# Patient Record
Sex: Female | Born: 1990 | Race: Black or African American | Hispanic: No | Marital: Single | Smoking: Former smoker
Health system: Southern US, Community
[De-identification: ages and names within clinical notes are randomized; demographics above are authoritative.]

## PROBLEM LIST (undated history)

## (undated) ENCOUNTER — Emergency Department (HOSPITAL_COMMUNITY): Admission: EM | Payer: Managed Care, Other (non HMO) | Source: Home / Self Care

## (undated) DIAGNOSIS — G510 Bell's palsy: Secondary | ICD-10-CM

## (undated) DIAGNOSIS — M0800 Unspecified juvenile rheumatoid arthritis of unspecified site: Secondary | ICD-10-CM

## (undated) DIAGNOSIS — O99345 Other mental disorders complicating the puerperium: Secondary | ICD-10-CM

## (undated) DIAGNOSIS — F53 Postpartum depression: Secondary | ICD-10-CM

## (undated) DIAGNOSIS — J039 Acute tonsillitis, unspecified: Secondary | ICD-10-CM

## (undated) HISTORY — PX: OTHER SURGICAL HISTORY: SHX169

## (undated) HISTORY — DX: Unspecified juvenile rheumatoid arthritis of unspecified site: M08.00

## (undated) HISTORY — PX: ABDOMINAL SURGERY: SHX537

## (undated) HISTORY — DX: Bell's palsy: G51.0

---
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
## 2010-04-11 ENCOUNTER — Emergency Department (HOSPITAL_COMMUNITY): Admission: EM | Admit: 2010-04-11 | Discharge: 2010-04-12 | Payer: Self-pay | Admitting: Emergency Medicine

## 2010-08-03 ENCOUNTER — Other Ambulatory Visit (HOSPITAL_COMMUNITY): Payer: Self-pay | Admitting: Obstetrics and Gynecology

## 2010-08-03 DIAGNOSIS — IMO0002 Reserved for concepts with insufficient information to code with codable children: Secondary | ICD-10-CM

## 2010-08-03 DIAGNOSIS — Z0489 Encounter for examination and observation for other specified reasons: Secondary | ICD-10-CM

## 2010-08-08 LAB — WET PREP, GENITAL
Trich, Wet Prep: NONE SEEN
Yeast Wet Prep HPF POC: NONE SEEN

## 2010-08-08 LAB — URINALYSIS, ROUTINE W REFLEX MICROSCOPIC
Glucose, UA: NEGATIVE mg/dL
Hgb urine dipstick: NEGATIVE
Ketones, ur: 40 mg/dL — AB
Leukocytes, UA: NEGATIVE
Nitrite: NEGATIVE
Protein, ur: 30 mg/dL — AB
Specific Gravity, Urine: 1.035 — ABNORMAL HIGH (ref 1.005–1.030)
Urobilinogen, UA: 1 mg/dL (ref 0.0–1.0)
pH: 6.5 (ref 5.0–8.0)

## 2010-08-08 LAB — URINE MICROSCOPIC-ADD ON

## 2010-08-08 LAB — POCT I-STAT, CHEM 8
BUN: 11 mg/dL (ref 6–23)
Calcium, Ion: 1.24 mmol/L (ref 1.12–1.32)
Chloride: 101 mEq/L (ref 96–112)
Creatinine, Ser: 0.7 mg/dL (ref 0.4–1.2)
Glucose, Bld: 88 mg/dL (ref 70–99)
HCT: 45 % (ref 36.0–46.0)
Hemoglobin: 15.3 g/dL — ABNORMAL HIGH (ref 12.0–15.0)
Potassium: 3.9 mEq/L (ref 3.5–5.1)
Sodium: 137 mEq/L (ref 135–145)
TCO2: 27 mmol/L (ref 0–100)

## 2010-08-08 LAB — HCG, QUANTITATIVE, PREGNANCY: hCG, Beta Chain, Quant, S: 99675 m[IU]/mL — ABNORMAL HIGH (ref <5)

## 2010-08-08 LAB — GC/CHLAMYDIA PROBE AMP, GENITAL
Chlamydia, DNA Probe: NEGATIVE
GC Probe Amp, Genital: NEGATIVE

## 2010-08-08 LAB — POCT PREGNANCY, URINE: Preg Test, Ur: POSITIVE

## 2010-08-08 LAB — RPR: RPR Ser Ql: NONREACTIVE

## 2010-08-16 ENCOUNTER — Ambulatory Visit (HOSPITAL_COMMUNITY): Payer: Medicaid Other

## 2010-08-16 ENCOUNTER — Ambulatory Visit (HOSPITAL_COMMUNITY)
Admission: RE | Admit: 2010-08-16 | Discharge: 2010-08-16 | Disposition: A | Discharge status: Home / Self Care | Payer: Managed Care, Other (non HMO) | Source: Ambulatory Visit | Attending: Obstetrics and Gynecology | Admitting: Obstetrics and Gynecology

## 2010-08-16 ENCOUNTER — Inpatient Hospital Stay (HOSPITAL_COMMUNITY)
Admission: AD | Admit: 2010-08-16 | Discharge: 2010-08-19 | DRG: 778 | Disposition: A | Discharge status: Home / Self Care | Payer: Managed Care, Other (non HMO) | Source: Ambulatory Visit | Attending: Obstetrics and Gynecology | Admitting: Obstetrics and Gynecology

## 2010-08-16 ENCOUNTER — Other Ambulatory Visit (HOSPITAL_COMMUNITY): Payer: Self-pay | Admitting: Obstetrics and Gynecology

## 2010-08-16 ENCOUNTER — Inpatient Hospital Stay (HOSPITAL_COMMUNITY)
Admission: AD | Admit: 2010-08-16 | Payer: Managed Care, Other (non HMO) | Source: Ambulatory Visit | Admitting: Obstetrics and Gynecology

## 2010-08-16 DIAGNOSIS — O30049 Twin pregnancy, dichorionic/diamniotic, unspecified trimester: Secondary | ICD-10-CM

## 2010-08-16 DIAGNOSIS — IMO0002 Reserved for concepts with insufficient information to code with codable children: Secondary | ICD-10-CM

## 2010-08-16 DIAGNOSIS — Z363 Encounter for antenatal screening for malformations: Secondary | ICD-10-CM | POA: Insufficient documentation

## 2010-08-16 DIAGNOSIS — Z0489 Encounter for examination and observation for other specified reasons: Secondary | ICD-10-CM

## 2010-08-16 DIAGNOSIS — O26879 Cervical shortening, unspecified trimester: Secondary | ICD-10-CM | POA: Diagnosis present

## 2010-08-16 DIAGNOSIS — O358XX0 Maternal care for other (suspected) fetal abnormality and damage, not applicable or unspecified: Secondary | ICD-10-CM | POA: Insufficient documentation

## 2010-08-16 DIAGNOSIS — Z1389 Encounter for screening for other disorder: Secondary | ICD-10-CM | POA: Insufficient documentation

## 2010-08-16 DIAGNOSIS — O30009 Twin pregnancy, unspecified number of placenta and unspecified number of amniotic sacs, unspecified trimester: Secondary | ICD-10-CM

## 2010-08-16 DIAGNOSIS — O4700 False labor before 37 completed weeks of gestation, unspecified trimester: Principal | ICD-10-CM | POA: Diagnosis present

## 2010-08-16 LAB — URINALYSIS, ROUTINE W REFLEX MICROSCOPIC
Bilirubin Urine: NEGATIVE
Glucose, UA: NEGATIVE mg/dL
Hgb urine dipstick: NEGATIVE
Ketones, ur: >80 mg/dL — AB
Nitrite: NEGATIVE
Protein, ur: NEGATIVE mg/dL
Specific Gravity, Urine: >1.03 — ABNORMAL HIGH (ref 1.005–1.030)
Urobilinogen, UA: 0.2 mg/dL (ref 0.0–1.0)
pH: 6 (ref 5.0–8.0)

## 2010-08-16 LAB — URINE MICROSCOPIC-ADD ON

## 2010-08-16 LAB — CBC
HCT: 36 % (ref 36.0–46.0)
Hemoglobin: 11.8 g/dL — ABNORMAL LOW (ref 12.0–15.0)
MCH: 29.2 pg (ref 26.0–34.0)
MCHC: 32.8 g/dL (ref 30.0–36.0)
MCV: 89.1 fL (ref 78.0–100.0)
Platelets: 249 10*3/uL (ref 150–400)
RBC: 4.04 MIL/uL (ref 3.87–5.11)
RDW: 13.5 % (ref 11.5–15.5)
WBC: 9.9 10*3/uL (ref 4.0–10.5)

## 2010-08-17 LAB — RPR: RPR Ser Ql: NONREACTIVE

## 2010-08-18 ENCOUNTER — Inpatient Hospital Stay (HOSPITAL_COMMUNITY): Payer: Managed Care, Other (non HMO)

## 2010-08-18 LAB — STREP B DNA PROBE: Strep Group B Ag: NEGATIVE

## 2010-08-19 ENCOUNTER — Encounter (HOSPITAL_COMMUNITY): Payer: Self-pay | Admitting: *Deleted

## 2010-08-26 ENCOUNTER — Inpatient Hospital Stay (HOSPITAL_COMMUNITY)
Admission: AD | Admit: 2010-08-26 | Discharge: 2010-08-26 | Disposition: A | Discharge status: Home / Self Care | Payer: Managed Care, Other (non HMO) | Source: Ambulatory Visit | Attending: Obstetrics and Gynecology | Admitting: Obstetrics and Gynecology

## 2010-08-26 DIAGNOSIS — O99891 Other specified diseases and conditions complicating pregnancy: Secondary | ICD-10-CM | POA: Insufficient documentation

## 2010-08-26 DIAGNOSIS — N898 Other specified noninflammatory disorders of vagina: Secondary | ICD-10-CM | POA: Insufficient documentation

## 2010-08-26 DIAGNOSIS — R109 Unspecified abdominal pain: Secondary | ICD-10-CM | POA: Insufficient documentation

## 2010-08-26 DIAGNOSIS — O9989 Other specified diseases and conditions complicating pregnancy, childbirth and the puerperium: Secondary | ICD-10-CM

## 2010-08-26 LAB — URINALYSIS, ROUTINE W REFLEX MICROSCOPIC
Bilirubin Urine: NEGATIVE
Glucose, UA: NEGATIVE mg/dL
Hgb urine dipstick: NEGATIVE
Ketones, ur: 15 mg/dL — AB
Nitrite: NEGATIVE
Protein, ur: NEGATIVE mg/dL
Specific Gravity, Urine: 1.015 (ref 1.005–1.030)
Urobilinogen, UA: 0.2 mg/dL (ref 0.0–1.0)
pH: 6.5 (ref 5.0–8.0)

## 2010-08-26 LAB — URINE MICROSCOPIC-ADD ON

## 2010-09-12 ENCOUNTER — Inpatient Hospital Stay (HOSPITAL_COMMUNITY)
Admission: AD | Admit: 2010-09-12 | Discharge: 2010-09-15 | DRG: 778 | Disposition: A | Discharge status: Home / Self Care | Payer: Managed Care, Other (non HMO) | Source: Ambulatory Visit | Attending: Obstetrics and Gynecology | Admitting: Obstetrics and Gynecology

## 2010-09-12 DIAGNOSIS — O3090 Multiple gestation, unspecified, unspecified trimester: Secondary | ICD-10-CM | POA: Diagnosis present

## 2010-09-12 DIAGNOSIS — O30009 Twin pregnancy, unspecified number of placenta and unspecified number of amniotic sacs, unspecified trimester: Secondary | ICD-10-CM | POA: Diagnosis present

## 2010-09-12 DIAGNOSIS — O4700 False labor before 37 completed weeks of gestation, unspecified trimester: Principal | ICD-10-CM | POA: Diagnosis present

## 2010-09-12 DIAGNOSIS — O26879 Cervical shortening, unspecified trimester: Secondary | ICD-10-CM | POA: Diagnosis present

## 2010-09-12 LAB — CBC
HCT: 37.4 % (ref 36.0–46.0)
Hemoglobin: 12.1 g/dL (ref 12.0–15.0)
MCH: 28.5 pg (ref 26.0–34.0)
MCHC: 32.4 g/dL (ref 30.0–36.0)
MCV: 88 fL (ref 78.0–100.0)
Platelets: 241 10*3/uL (ref 150–400)
RBC: 4.25 MIL/uL (ref 3.87–5.11)
RDW: 13.4 % (ref 11.5–15.5)
WBC: 6.5 10*3/uL (ref 4.0–10.5)

## 2010-09-12 LAB — RPR: RPR Ser Ql: NONREACTIVE

## 2010-09-14 LAB — STREP B DNA PROBE: Strep Group B Ag: NEGATIVE

## 2010-09-20 NOTE — Discharge Summary (Signed)
  Barbara Zimmerman, Barbara Zimmerman                ACCOUNT NO.:  [id number]  MEDICAL RECORD NO.:  [id number]           PATIENT TYPE:  I  LOCATION:  9158                          FACILITY:  [facility]  PHYSICIAN:  Juluis Mire, M.D.   DATE OF BIRTH:  1991-02-18  DATE OF ADMISSION:  09/12/2010 DATE OF DISCHARGE:  09/15/2010                              DISCHARGE SUMMARY   ADMITTING DIAGNOSIS:  Twin pregnancies at 66 and 4/7th with shortening cervix.  DISCHARGE DIAGNOSIS:  Twin pregnancies at 28 and 4/7th with shortening cervix.  PROCEDURE:  Tocolysis.  For complete history and physical, please see dictated note.  COURSE IN THE HOSPITAL:  The patient was brought in, begun on magnesium sulfate.  Subsequently was weaned off magnesium sulfate after uterine activity resolved.  She was placed on p.o. Procardia.  She did well throughout the 19th on Procardia, no contractions.  The next morning she was not contracting.  On exam, her cervix felt long and closed.  We cancelled the ultrasound for that morning.  We are going to discharge her home at this point in time.  Fetal heart tones were audible.  Uterus is nontender.  There is no leaking or bleeding.  In terms of complication, none encountered during stay in the hospital. Discharged home in stable condition.  DISPOSITION:  The patient will continue bedrest and Procardia at home. Should call with increasing uterine activity, back discomfort, pressure, any leaking or bleeding.  She is going to follow up in the office on Monday at 10:30.  She is given prescription for Procardia and again preterm labor warning signs are given.     Juluis Mire, M.D.     JSM/MEDQ  D:  09/15/2010  T:  09/15/2010  Job:  [id number]  Electronically Signed by Richardean Chimera M.D. on 09/20/2010 05:40:07 AM

## 2010-10-04 NOTE — Discharge Summary (Signed)
  NAMEZENITA, KISTER                ACCOUNT NO.:  [id number]  MEDICAL RECORD NO.:  [id number]           PATIENT TYPE:  LOCATION:                                 FACILITY:  PHYSICIAN:  Duke Salvia. Marcelle Overlie, M.D.DATE OF BIRTH:  [birth year]  DATE OF ADMISSION:  09/16/2010 DATE OF DISCHARGE:  09/19/2010                              DISCHARGE SUMMARY   ADMITTING DIAGNOSES: 1. Intrauterine pregnancy at 24-5/7 weeks estimated gestational age. 2. Twin gestation. 3. Cervical shortening.  DISCHARGE DIAGNOSES: 1. Intrauterine pregnancy at 25-2/7 weeks estimated gestational age. 2. Twin gestation. 3. Short cervix, stable.  REASON FOR ADMISSION:  Please see written H and P.  HOSPITAL COURSE:  The patient is a 20 year old primigravida who was admitted to [facility] at 24-5/7 weeks estimated gestational age for observation.  The patient did have known twin gestation that was being evaluated by Maternal Fetal Medicine.  Did an ultrasound for anatomy screening which was normal.  Cervix was noted to be shortened with some funneling.  The patient had complained of some increasing pressure over the week, and vital signs were stable, fetal heart tones were in the 140s.  The patient was placed on the monitor and an occasional contraction was noted.  Cervical length by ultrasound was measured at 1.1 cm.  The patient was now hospitalized for observation and betamethasone administration, group B beta strep culture.  The patient was started on antibiotics prophylactically.  On the following morning, the patient reported good fetal movement, denied contractions, or vaginal bleeding.  Fetal heart tones were within normal limits.  No contractions were noted.  Abdomen soft and nontender, and the patient was continued on bedrest and completion of the steroid series was given. Ultrasound was ordered for the following morning for stability of the cervical length.  On the following morning, the  patient was resting well.  Vital signs were stable.  Fetal heart tones were in the 140s. The patient was started on some p.o. Procardia.  On the following morning, cervical length was 1.2 cm by ultrasound, vital signs remained stable.  Discharge instructions were reviewed, and the patient was later discharged home.  CONDITION ON DISCHARGE:  Stable.  DIET:  Regular as tolerated.  ACTIVITY:  Bed rest with bathroom privileges.  She is to call for increase in pressure, loss of amniotic fluid, vaginal bleeding, or decreasing fetal movement.  DISCHARGE MEDICATIONS: 1. Procardia 10 mg every 8 hours. 2. 17-hydroxyprogesterone 250 mg IM weekly. 3. Prenatal vitamins one p.o. daily.     Julio Sicks, N.P.   ______________________________ Duke Salvia. Marcelle Overlie, M.D.    CC/MEDQ  D:  09/20/2010  T:  09/20/2010  Job:  [id number]  Electronically Signed by Julio Sicks N.P. on 09/25/2010 08:35:38 AM Electronically Signed by Richarda Overlie M.D. on 10/04/2010 09:25:52 AM

## 2010-10-10 ENCOUNTER — Other Ambulatory Visit: Payer: Managed Care, Other (non HMO)

## 2010-10-10 DIAGNOSIS — O30009 Twin pregnancy, unspecified number of placenta and unspecified number of amniotic sacs, unspecified trimester: Secondary | ICD-10-CM

## 2010-10-10 NOTE — H&P (Signed)
  NAMEKRISALYN, YANKOWSKI                ACCOUNT NO.:  [id number]  MEDICAL RECORD NO.:  [id number]           PATIENT TYPE:  I  LOCATION:  9158                          [facility]ILITY:  [facility]  PHYSICIAN:  Guy Sandifer. Henderson Cloud, M.D. DATE OF BIRTH:  1990-07-10  DATE OF ADMISSION:  09/12/2010 DATE OF DISCHARGE:                             HISTORY & PHYSICAL   ADMITTING DIAGNOSES: 1. Intrauterine pregnancy with twin gestation at 28-4/7 weeks. 2. Shortened cervix.  HISTORY OF PRESENT ILLNESS:  This patient is a 20 year old single black female, G1, P0 with an EDC of December 01, 2010, established by ultrasound at 9-0/7 weeks estimated gestational age, placing her at 28-4/7 weeks. Prenatal care has been complicated by twin gestation, one female, one female infant.  Ultrasound with maternal fetal medicine was undertaken to complete anatomic survey and evaluate echogenic foci noted in the heart of both babies.  It was noted to be persistent in baby B.  She was subsequently admitted to hospital at approximately 25 weeks estimated additional age where she received betamethasone.  She was discharged on weekly progesterone, oral Procardia, and bedrest.  Ultrasound in the office on September 05, 2010, revealed a cervical length of 1.7 cm.  Repeat on the day of admission September 12, 2010, at 28-4/7 weeks reveals the cervix measuring 1.2 cm with some funneling down noted.  Baby A is breech, weighing 1302 grams at the 80th percentile and baby B is transverse, weighing 1278 grams at the 78th percentile.  Normal amniotic fluid was noted on both babies.  The patient did receive her hydroxyprogesterone shot in the office on the day of admission.  She denies leaking fluid vaginal bleeding or heavy contractions.  Past medical history, past surgical history, family history, obstetric history, social history, see prenatal history and physical.  MEDICATIONS: 1. Prenatal vitamins. 2. Procardia 10 mg t.i.d. 3. Progesterone injections  weekly.  ALLERGIES:  No known drug allergies.  PHYSICAL EXAM:  VITAL SIGNS:  Height 5 feet 5 inches, weight 254.2 pounds, blood pressure 118/72. GU:  Cervix is as above.  She was noted to have 5 contractions in an hour on the monitor.  ASSESSMENT: 1. Intrauterine twin gestation at 28-4/7 weeks. 2. Breech transverse presentation. 3. Progressively shortening cervix with funneling.  PLAN:  We will admit to maternity admissions, close observation, fetal monitoring.  We will tocolyze if she begins to contract regularly.     Guy Sandifer Henderson Cloud, M.D.     JET/MEDQ  D:  09/12/2010  T:  09/12/2010  Job:  [id number]  Electronically Signed by Harold Hedge M.D. on 10/10/2010 01:45:45 PM

## 2010-10-16 ENCOUNTER — Inpatient Hospital Stay (HOSPITAL_COMMUNITY)
Admission: AD | Admit: 2010-10-16 | Discharge: 2010-10-16 | Disposition: A | Discharge status: Home / Self Care | Payer: Managed Care, Other (non HMO) | Source: Ambulatory Visit | Attending: Obstetrics and Gynecology | Admitting: Obstetrics and Gynecology

## 2010-10-16 DIAGNOSIS — O4700 False labor before 37 completed weeks of gestation, unspecified trimester: Secondary | ICD-10-CM | POA: Insufficient documentation

## 2010-10-16 LAB — URINALYSIS, ROUTINE W REFLEX MICROSCOPIC
Bilirubin Urine: NEGATIVE
Glucose, UA: NEGATIVE mg/dL
Ketones, ur: NEGATIVE mg/dL
Nitrite: NEGATIVE
Protein, ur: NEGATIVE mg/dL
Specific Gravity, Urine: 1.01 (ref 1.005–1.030)
Urobilinogen, UA: 0.2 mg/dL (ref 0.0–1.0)
pH: 6.5 (ref 5.0–8.0)

## 2010-10-16 LAB — URINE MICROSCOPIC-ADD ON

## 2010-10-17 ENCOUNTER — Other Ambulatory Visit: Payer: Managed Care, Other (non HMO)

## 2010-10-17 DIAGNOSIS — O30009 Twin pregnancy, unspecified number of placenta and unspecified number of amniotic sacs, unspecified trimester: Secondary | ICD-10-CM

## 2010-10-22 ENCOUNTER — Inpatient Hospital Stay (HOSPITAL_COMMUNITY)
Admission: AD | Admit: 2010-10-22 | Discharge: 2010-10-23 | Disposition: A | Discharge status: Home / Self Care | Payer: Managed Care, Other (non HMO) | Source: Ambulatory Visit | Attending: Obstetrics and Gynecology | Admitting: Obstetrics and Gynecology

## 2010-10-22 DIAGNOSIS — O4700 False labor before 37 completed weeks of gestation, unspecified trimester: Secondary | ICD-10-CM

## 2010-10-24 ENCOUNTER — Other Ambulatory Visit: Payer: Managed Care, Other (non HMO)

## 2010-10-25 ENCOUNTER — Other Ambulatory Visit: Payer: Managed Care, Other (non HMO)

## 2010-10-25 DIAGNOSIS — O30009 Twin pregnancy, unspecified number of placenta and unspecified number of amniotic sacs, unspecified trimester: Secondary | ICD-10-CM

## 2010-10-28 ENCOUNTER — Inpatient Hospital Stay (HOSPITAL_COMMUNITY)
Admission: AD | Admit: 2010-10-28 | Discharge: 2010-10-29 | Disposition: A | Discharge status: Home / Self Care | Payer: Managed Care, Other (non HMO) | Source: Ambulatory Visit | Attending: Obstetrics and Gynecology | Admitting: Obstetrics and Gynecology

## 2010-10-28 DIAGNOSIS — IMO0002 Reserved for concepts with insufficient information to code with codable children: Secondary | ICD-10-CM | POA: Insufficient documentation

## 2010-10-29 LAB — URINALYSIS, ROUTINE W REFLEX MICROSCOPIC
Bilirubin Urine: NEGATIVE
Glucose, UA: NEGATIVE mg/dL
Ketones, ur: 15 mg/dL — AB
Nitrite: NEGATIVE
Protein, ur: NEGATIVE mg/dL
Specific Gravity, Urine: 1.025 (ref 1.005–1.030)
Urobilinogen, UA: 0.2 mg/dL (ref 0.0–1.0)
pH: 6 (ref 5.0–8.0)

## 2010-10-29 LAB — URINE MICROSCOPIC-ADD ON

## 2010-10-30 ENCOUNTER — Inpatient Hospital Stay (HOSPITAL_COMMUNITY)
Admission: AD | Admit: 2010-10-30 | Discharge: 2010-11-03 | DRG: 765 | Disposition: A | Discharge status: Home / Self Care | Payer: Managed Care, Other (non HMO) | Source: Ambulatory Visit | Attending: Obstetrics and Gynecology | Admitting: Obstetrics and Gynecology

## 2010-10-30 ENCOUNTER — Other Ambulatory Visit: Payer: Self-pay | Admitting: Obstetrics and Gynecology

## 2010-10-30 DIAGNOSIS — O30009 Twin pregnancy, unspecified number of placenta and unspecified number of amniotic sacs, unspecified trimester: Secondary | ICD-10-CM | POA: Diagnosis present

## 2010-10-30 DIAGNOSIS — O3090 Multiple gestation, unspecified, unspecified trimester: Secondary | ICD-10-CM | POA: Diagnosis present

## 2010-10-30 DIAGNOSIS — O4290 Premature rupture of membranes, unspecified as to length of time between rupture and onset of labor, unspecified weeks of gestation: Principal | ICD-10-CM | POA: Diagnosis present

## 2010-10-30 LAB — CBC
HCT: 34.5 % — ABNORMAL LOW (ref 36.0–46.0)
Hemoglobin: 11.4 g/dL — ABNORMAL LOW (ref 12.0–15.0)
MCH: 27.7 pg (ref 26.0–34.0)
MCHC: 33 g/dL (ref 30.0–36.0)
MCV: 83.9 fL (ref 78.0–100.0)
Platelets: 192 10*3/uL (ref 150–400)
RBC: 4.11 MIL/uL (ref 3.87–5.11)
RDW: 13.6 % (ref 11.5–15.5)
WBC: 6 10*3/uL (ref 4.0–10.5)

## 2010-10-30 LAB — RPR: RPR Ser Ql: NONREACTIVE

## 2010-10-31 ENCOUNTER — Other Ambulatory Visit: Payer: Managed Care, Other (non HMO)

## 2010-10-31 LAB — CBC
HCT: 26.4 % — ABNORMAL LOW (ref 36.0–46.0)
Hemoglobin: 8.7 g/dL — ABNORMAL LOW (ref 12.0–15.0)
MCH: 27.8 pg (ref 26.0–34.0)
MCHC: 33 g/dL (ref 30.0–36.0)
MCV: 84.3 fL (ref 78.0–100.0)
Platelets: 177 10*3/uL (ref 150–400)
RBC: 3.13 MIL/uL — ABNORMAL LOW (ref 3.87–5.11)
RDW: 13.7 % (ref 11.5–15.5)
WBC: 10 10*3/uL (ref 4.0–10.5)

## 2010-10-31 LAB — URINE CULTURE
Colony Count: 60000
Culture  Setup Time: 201206031124

## 2010-11-07 ENCOUNTER — Other Ambulatory Visit: Payer: Managed Care, Other (non HMO)

## 2010-11-10 ENCOUNTER — Inpatient Hospital Stay (HOSPITAL_COMMUNITY): Admission: AD | Admit: 2010-11-10 | Payer: Self-pay | Source: Home / Self Care | Admitting: Obstetrics and Gynecology

## 2010-11-15 ENCOUNTER — Other Ambulatory Visit: Payer: Managed Care, Other (non HMO)

## 2010-11-22 ENCOUNTER — Other Ambulatory Visit: Payer: Managed Care, Other (non HMO)

## 2010-11-24 NOTE — Discharge Summary (Signed)
NAMEBLAKELEY, SCHEIER                ACCOUNT NO.:  [id number]  MEDICAL RECORD NO.:  [id number]  LOCATION:  9105                          [facility]ACILITY:  [facility]  PHYSICIAN:  Guy Sandifer. Henderson Cloud, M.D. DATE OF BIRTH:  [birth year]  DATE OF ADMISSION:  10/30/2010 DATE OF DISCHARGE:  11/03/2010                              DISCHARGE SUMMARY   ADMITTING DIAGNOSES: 1. Intrauterine pregnancy at 35-1/2 weeks' estimated gestational age. 2. Twin gestation. 3. Premature rupture of membranes.  DISCHARGE DIAGNOSES: 1. Status post low transverse cesarean section. 2. Viable female and female infant.  PROCEDURE:  Primary low transverse cesarean section.  REASON FOR ADMISSION:  Please see written H and P.  HOSPITAL COURSE:  The patient is a 20 year old, primigravida, who was admitted to [facility] at 35-1/2 weeks' estimated gestational age with preterm rupture of membranes.  The patient's, pregnancy had been complicated by twin gestation.  On admission, vital signs were stable.  Fetal heart tones were reactive.  Rupture of membrane was noted with clear fluid.  Decision was made to proceed with a primary low transverse cesarean section.  The patient was then taken to operating room where spinal anesthesia was administered without difficulty.  A low transverse incision was made with delivery of a viable baby A female infant in the breech presentation with Apgars of 8 at 1 and 9 at 5 minutes.  Arterial cord pH of 7.32.  Baby B in transverse position with a female infant, weighing 5 pounds 2 ounces with Apgars of 8 at 1 minute and 9 at 5 minutes.  Arterial cord pH of 7.31.  The patient tolerated the procedure well and taken to the recovery room in stable condition.  On postoperative day #1, vital signs were stable. Baby A was in the NICU.  Abdomen soft.  Abdominal dressing was noted be clean, dry, and intact.  On postoperative day #2, vital signs were stable.  She is afebrile.  Abdomen soft.   Fundus firm and nontender. Incision was clean, dry, and intact.  Baby girl was in the NICU due to decrease in blood sugars.  On postoperative day #3, the patient was doing well.  Vital signs were stable.  Blood pressure 146/77 and 130/84. Deep tendon reflexes were 1+, no clonus, 2+ pedal edema was observed. Abdomen soft.  Fundus firm and nontender.  She is ambulating without difficulty.  On postoperative day #3, vital signs were stable.  Fundus firm and nontender.  Incision was clean, dry, and intact.  Staples were removed.  Discharge instructions were reviewed and the patient was later discharged home.  CONDITION ON DISCHARGE:  Stable.  DIET:  Regular as tolerated.  ACTIVITY:  No heavy lifting.  No driving x2 weeks.  No vaginal entry.  FOLLOWUP:  The patient is to follow up in the office in 1 week for an incision check.  She is to call for temperature greater than 100 degrees, persistent nausea or vomiting, heavy vaginal bleeding, and/or redness or drainage from incisional site.  DISCHARGE MEDICATIONS:  Tylox #30 one p.o. every 4-6 hours p.r.n., Motrin 600 mg every 6 hours, prenatal vitamins one p.o. daily, Colace one p.o. daily p.r.n.  Julio Sicks, N.P.   ______________________________ Guy Sandifer. Henderson Cloud, M.D.    CC/MEDQ  D:  11/03/2010  T:  11/04/2010  Job:  [id number]  Electronically Signed by Julio Sicks N.P. on 11/07/2010 08:36:03 AM Electronically Signed by Harold Hedge M.D. on 11/24/2010 09:14:09 AM

## 2010-11-28 ENCOUNTER — Other Ambulatory Visit: Payer: Managed Care, Other (non HMO)

## 2010-12-04 NOTE — H&P (Signed)
  NAMECRISTY, COLMENARES                ACCOUNT NO.:  [id number]  MEDICAL RECORD NO.:  [id number]  LOCATION:  9105                          [facility]ILITY:  [facility]  PHYSICIAN:  Dineen Kid. Rana Snare, M.D.    DATE OF BIRTH:  03-15-1991  DATE OF ADMISSION:  10/30/2010 DATE OF DISCHARGE:                             HISTORY & PHYSICAL   HISTORY OF PRESENT ILLNESS:  Ms. Hayworth is a 20 year old G1, P0 at 37- 1/2 weeks' gestational age, who presents with twin pregnancy in the breech transverse presentation with rupture of membranes at 7:45 this morning, clear fluid.  Her pregnancy has been complicated by twin presentation in the breech transverse position.  She does have a sensitivity to latex which caused a rash with gloves, but no known drug allergies.  Her pregnancy has been complicated by preterm labor, on bedrest, and Procardia, and has been hospitalized, receiving magnesium sulfate and also betamethasone.  There has been appropriate growth throughout the pregnancy and a last ultrasound showed each baby to be 5- 1/2 pounds that was last week.  PAST MEDICAL HISTORY:  Negative.  PAST SURGICAL HISTORY:  Negative.  MEDICATIONS:  Prenatal vitamins and Procardia.  No known drug allergies but latex sensitivity with a rash.  PHYSICAL EXAMINATION:  VITAL SIGNS:  Blood pressure is 120/60. HEART:  Regular rate and rhythm. LUNGS:  Clear to auscultation bilaterally. ABDOMEN:  Gravid, nontender.  Cervix per the RN in Triage is 3, 75%, and high with gross rupture of membranes, clear fluid, and breech presentation of fetus A. Fetal heart rates are reactive x2.  IMPRESSION:  Twin pregnancy at 35-1/2 weeks' gestational age with premature rupture of membranes, breech transverse presentation.  PLAN:  Primary low segment transverse cesarean section for delivery. Risks and benefits were discussed at length which include but not limited to risk of infection, bleeding, damage to the uterus, tubes, ovaries, bowel,  bladder,, fetuses, risk of prematurity discussed.  We will go ahead and need to start 1 g of cefotetan preoperatively.     Dineen Kid Rana Snare, M.D.    DCL/MEDQ  D:  10/30/2010  T:  10/31/2010  Job:  [id number]  Electronically Signed by Candice Camp M.D. on 12/04/2010 07:53:54 AM

## 2010-12-04 NOTE — Op Note (Signed)
Barbara Zimmerman, Barbara Zimmerman                ACCOUNT NO.:  [id number]  MEDICAL RECORD NO.:  [id number]  LOCATION:  9105                          [facility]ILITY[facility]  PHYSICIAN:  Dineen Kid. Rana Snare, M.D.    DATE OF BIRTH:  21-Feb-1991  DATE OF PROCEDURE:  10/30/2010 DATE OF DISCHARGE:                              OPERATIVE REPORT   PREOPERATIVE DIAGNOSIS:  Intrauterine pregnancy with twins at 35-1/2 weeks' gestational age with breech transverse presentation and premature rupture of membranes.  POSTOPERATIVE DIAGNOSIS:  Intrauterine pregnancy with twins at 35-1/2 weeks' gestational age with breech transverse presentation and premature rupture of membranes.  PROCEDURE:  Primary low segment transverse cesarean section.  SURGEON:  Dineen Kid. Rana Snare, MD  ANESTHESIA:  Spinal.  INDICATIONS:  Barbara Zimmerman is a 20 year old, G1 at 35-1/2 weeks. Pregnancy has been complicated by preterm labor with previous admission for magnesium and betamethasone.  She presents with spontaneous rupture of membranes at 7:45 with clear fluid.  Plan to proceed with primary low segment transverse cesarean section.  Risks and benefits were discussed. Informed consent was obtained.  FINDINGS AT THE TIME OF SURGERY:  Viable female infant, baby A in the breech position, Apgars were 8 and 9, pH arterial 7.32 with a weight of 5 pounds and 12 ounces.  Baby B was transverse, converted to vertex, baby female, Apgars 8 and 9, pH arterial 7.31 with a weight of 5 pounds and 2 ounces.  DESCRIPTION OF PROCEDURE:  After adequate analgesia, the patient was placed in the supine position with left lateral tilt.  She was sterilely prepped and draped.  Bladder was sterilely drained with a Foley catheter.  A Pfannenstiel skin incision was made 2 fingerbreadths above the pubic symphysis, taken down sharply to fascia which was incised transversely and extended superiorly and inferiorly off the bellies of rectus muscle which were separated sharply in  midline.  Peritoneum was entered sharply.  Bladder flap created and Alexis self-retaining retractor was placed.  A low segment myotomy incision was made down to the amniotic sac.  The baby B on the maternal left was delivered in the frank breech position with a very easy delivery.  The nares and pharynx were suctioned.  Cord was clamped and cut and handed to pediatrician for resuscitation.  Cord blood was obtained.  The cord was then clamped with a single umbilical clamp.  Fetal vertex was guided into the pelvis converting transverse into the vertex presentation.  Amniotomy was performed and vertex of baby B was easily delivered.  Nares and pharynx were suctioned.  The infant was delivered, cord was clamped and cut, and handed to pediatrician for resuscitation.  Cord blood was then obtained. The placenta was extracted manually.  The uterus was extended, exteriorized, wiped clean with a dry lap.  The myotomy incision was closed in 2 layers, first being a running locking layer, second being imbricating layer of 0 Monocryl suture.  Uterus was placed back in the abdominal cavity.  After copious amount of irrigation, adequate hemostasis assured.  Peritoneum was closed with 0 Monocryl suture. Rectus muscle was plicated in the midline.  Irrigation was applied and after adequate hemostasis, the fascia was then closed  with a #1 PDS in a running fashion.  Irrigation was applied and after adequate hemostasis, skin was stapled, Steri- Strips were applied.  The patient tolerated the procedure well, stable, and transferred to the recovery room.  Sponge and instrument counts were normal x3.  Estimated blood loss 800 mL.  The patient received 1 g of cefotetan preoperatively.     Dineen Kid Rana Snare, M.D.     DCL/MEDQ  D:  10/30/2010  T:  10/31/2010  Job:  [id number]  Electronically Signed by Candice Camp M.D. on 12/04/2010 07:53:55 AM

## 2011-02-28 LAB — PREGNANCY, URINE: Preg Test, Ur: NEGATIVE

## 2011-02-28 LAB — URINALYSIS, ROUTINE W REFLEX MICROSCOPIC
Bilirubin Urine: NEGATIVE
Glucose, UA: NEGATIVE
Hgb urine dipstick: NEGATIVE
Ketones, ur: 40 — AB
Nitrite: NEGATIVE
Protein, ur: NEGATIVE
Specific Gravity, Urine: 1.033 — ABNORMAL HIGH
Urobilinogen, UA: 1
pH: 6

## 2011-05-01 ENCOUNTER — Emergency Department (HOSPITAL_BASED_OUTPATIENT_CLINIC_OR_DEPARTMENT_OTHER)
Admission: EM | Admit: 2011-05-01 | Discharge: 2011-05-01 | Disposition: A | Discharge status: Home / Self Care | Payer: Managed Care, Other (non HMO) | Attending: Emergency Medicine | Admitting: Emergency Medicine

## 2011-05-01 ENCOUNTER — Encounter (HOSPITAL_BASED_OUTPATIENT_CLINIC_OR_DEPARTMENT_OTHER): Payer: Self-pay | Admitting: *Deleted

## 2011-05-01 DIAGNOSIS — F17200 Nicotine dependence, unspecified, uncomplicated: Secondary | ICD-10-CM | POA: Insufficient documentation

## 2011-05-01 DIAGNOSIS — R21 Rash and other nonspecific skin eruption: Secondary | ICD-10-CM | POA: Insufficient documentation

## 2011-05-01 DIAGNOSIS — J45909 Unspecified asthma, uncomplicated: Secondary | ICD-10-CM | POA: Insufficient documentation

## 2011-05-01 DIAGNOSIS — T7840XA Allergy, unspecified, initial encounter: Secondary | ICD-10-CM

## 2011-05-01 HISTORY — DX: Other mental disorders complicating the puerperium: O99.345

## 2011-05-01 HISTORY — DX: Postpartum depression: F53.0

## 2011-05-01 MED ORDER — EPINEPHRINE 0.3 MG/0.3ML IJ DEVI: 0.3000 mg | Freq: Once | INTRAMUSCULAR | Status: DC

## 2011-05-01 MED ORDER — PREDNISONE 10 MG PO TABS: 60.0000 mg | ORAL_TABLET | Freq: Every day | ORAL | Status: AC

## 2011-05-01 MED ORDER — EPINEPHRINE 0.3 MG/0.3ML IJ DEVI
0.3000 mg | Freq: Once | INTRAMUSCULAR | Status: AC
  Administered 2011-05-01: 0.3 mg via INTRAMUSCULAR
  Filled 2011-05-01: qty 0.3

## 2011-05-01 MED ORDER — DIPHENHYDRAMINE HCL 25 MG PO TABS: 25.0000 mg | ORAL_TABLET | Freq: Four times a day (QID) | ORAL | Status: DC

## 2011-05-01 MED ORDER — DIPHENHYDRAMINE HCL 50 MG/ML IJ SOLN
50.0000 mg | Freq: Once | INTRAMUSCULAR | Status: AC
  Administered 2011-05-01: 50 mg via INTRAVENOUS
  Filled 2011-05-01: qty 1

## 2011-05-01 MED ORDER — SODIUM CHLORIDE 0.9 % IV BOLUS (SEPSIS)
1000.0000 mL | Freq: Once | INTRAVENOUS | Status: AC
  Administered 2011-05-01: 1000 mL via INTRAVENOUS

## 2011-05-01 MED ORDER — FAMOTIDINE 20 MG PO TABS: 20.0000 mg | ORAL_TABLET | Freq: Two times a day (BID) | ORAL | Status: DC

## 2011-05-01 MED ORDER — FAMOTIDINE IN NACL 20-0.9 MG/50ML-% IV SOLN
20.0000 mg | Freq: Once | INTRAVENOUS | Status: AC
  Administered 2011-05-01: 20 mg via INTRAVENOUS
  Filled 2011-05-01: qty 50

## 2011-05-01 MED ORDER — METHYLPREDNISOLONE SODIUM SUCC 125 MG IJ SOLR
125.0000 mg | Freq: Once | INTRAMUSCULAR | Status: AC
  Administered 2011-05-01: 125 mg via INTRAVENOUS
  Filled 2011-05-01: qty 2

## 2011-05-01 NOTE — ED Notes (Signed)
While triaging patient, she stated she felt light headed, bp checked and found to to 59 /32.  IV inserted and EDP updated and in the room for assessment.

## 2011-05-01 NOTE — ED Provider Notes (Signed)
History     CSN: [id number] Arrival date & time: 05/01/2011 10:41 AM   First MD Initiated Contact with Patient 05/01/11 1128      Chief Complaint  Patient presents with  . Rash    generalized body rash     Patient is a 20 y.o. female presenting with rash. The history is provided by the patient.  Rash    patient reports development of hives approximately 36 hours ago. She rep or sugars 3 days ago and she reports vomiting once this morning.  She has a history of asthma as well as eczema.  She has never had anaphylaxis.  She has not tried any medications.  She's not tried Benadryl.  She is without abdominal pain at this time.  She's had mild lightheadedness and also complains of mild swelling of her upper lip.  She denies use of lisinopril or other anti-inflammatories.  She has no difficulty speaking or swallowing.  She reports her breathing is normal.    Past Medical History  Diagnosis Date  . Asthma     childhood  . Post partum depression     Past Surgical History  Procedure Date  . Abdominal surgery   . C secion     No family history on file.  History  Substance Use Topics  . Smoking status: Current Everyday Smoker -- 0.5 packs/day for 5 years    Types: Cigarettes  . Smokeless tobacco: Not on file  . Alcohol Use: No    OB History    Grav Para Term Preterm Abortions TAB SAB Ect Mult Living   1               Review of Systems  Skin: Positive for rash.  All other systems reviewed and are negative.    Allergies  Review of patient's allergies indicates no known allergies.  Home Medications   Current Outpatient Rx  Name Route Sig Dispense Refill  . MULTI-VITAMIN/MINERALS PO TABS Oral Take 1 tablet by mouth daily.        BP 126/61  Pulse 112  Temp(Src) 98.5 F (36.9 C) (Oral)  Resp 20  Ht 5\' 5"  (1.651 m)  Wt 240 lb (108.863 kg)  BMI 39.94 kg/m2  SpO2 98%  LMP 04/28/2011  Breastfeeding? Unknown  Physical Exam  Nursing note and vitals  reviewed. Constitutional: She is oriented to person, place, and time. She appears well-developed and well-nourished. No distress.  HENT:  Head: Atraumatic.       Patient has some swelling of her upper lip.  She has no swelling of her tongue.  There is no submandibular or sublingular swelling.  Her posterior pharynx is normal with a patent airway.  She is tolerating her secretions  Eyes: EOM are normal.  Neck: Normal range of motion.  Cardiovascular: Normal rate, regular rhythm and normal heart sounds.   Pulmonary/Chest: Effort normal and breath sounds normal.  Abdominal: Soft. She exhibits no distension. There is no tenderness.  Musculoskeletal: Normal range of motion.  Neurological: She is alert and oriented to person, place, and time.  Skin: Skin is warm and dry.       Diffuse urticaria over her arms chest abdomen and legs  Psychiatric: She has a normal mood and affect. Judgment normal.    ED Course  Procedures (including critical care time)  Labs Reviewed - No data to display No results found.   1. Allergic reaction       MDM  Patient presents with what  appears to be severe allergic reaction with a low blood pressure.  Initially IM epinephrine wasn't given as the patient was having significant improvement with her Benadryl.  I chose to give IM epinephrine after I noted that her hives have not improved and she continued to complain of itching.  This was not given for anaphylaxis.  It's noted that her blood pressure was low on arrival however my suspicion was that the cuff did not fit right.  She was given IV fluids and repeat blood pressure was much improved.  She's able stand up at the bedside without difficulty.  She reports significant improvement in her rash as well as her upper lip swelling.  2 recent home on a prescription of prednisone Benadryl, Pepcid and she will be sent home with was sent home with an EpiPen   3:52 PM She has now been observed in the ER for a total of 5  hours. Will dc home        Lyanne Co, MD 05/01/11 1556

## 2011-05-01 NOTE — ED Notes (Signed)
Patient stated she developed a mild rash last night, this morning the rash became more generalized with large whelps.  States she also has abdominal pain and states she vomited x1 this am. States she ate oysters 3 days ago and feels that she has food poison.  Symptoms did not start until last night.  Patient was observed eating chicken on her way into the emergency department.

## 2012-02-07 ENCOUNTER — Encounter (HOSPITAL_COMMUNITY): Payer: Self-pay | Admitting: Emergency Medicine

## 2012-02-07 ENCOUNTER — Emergency Department (HOSPITAL_COMMUNITY)
Admission: EM | Admit: 2012-02-07 | Discharge: 2012-02-07 | Disposition: A | Discharge status: Home / Self Care | Payer: Managed Care, Other (non HMO) | Attending: Emergency Medicine | Admitting: Emergency Medicine

## 2012-02-07 DIAGNOSIS — B349 Viral infection, unspecified: Secondary | ICD-10-CM

## 2012-02-07 DIAGNOSIS — J029 Acute pharyngitis, unspecified: Secondary | ICD-10-CM

## 2012-02-07 DIAGNOSIS — B9789 Other viral agents as the cause of diseases classified elsewhere: Secondary | ICD-10-CM | POA: Insufficient documentation

## 2012-02-07 DIAGNOSIS — J45909 Unspecified asthma, uncomplicated: Secondary | ICD-10-CM | POA: Insufficient documentation

## 2012-02-07 DIAGNOSIS — F17200 Nicotine dependence, unspecified, uncomplicated: Secondary | ICD-10-CM | POA: Insufficient documentation

## 2012-02-07 LAB — RAPID STREP SCREEN (MED CTR MEBANE ONLY): Streptococcus, Group A Screen (Direct): NEGATIVE

## 2012-02-07 MED ORDER — IBUPROFEN 600 MG PO TABS: 600.0000 mg | ORAL_TABLET | Freq: Four times a day (QID) | ORAL | Status: DC | PRN

## 2012-02-07 MED ORDER — IBUPROFEN 400 MG PO TABS
600.0000 mg | ORAL_TABLET | Freq: Once | ORAL | Status: AC
  Administered 2012-02-07: 600 mg via ORAL
  Filled 2012-02-07: qty 1

## 2012-02-07 NOTE — ED Notes (Addendum)
Pt reports having sore throat/swollen tonsills, with headache, feels general malaise, having hot/cold flashes; denies cough, reports little congestion/phlegm; started today

## 2012-02-07 NOTE — ED Provider Notes (Signed)
History     CSN: [id number]  Arrival date & time 02/07/12  1851   First MD Initiated Contact with Patient 02/07/12 2041      Chief Complaint  Patient presents with  . Sore Throat    (Consider location/radiation/quality/duration/timing/severity/associated sxs/prior treatment) HPI Comments: Complaints of sore throat, headache, myalgias, low-grade fever today.  No known sick contacts.  She has not taken any over-the-counter medication.  Prior to arrival.  She has not E. anything to drink today, but she just didn't feel like it.  She felt too weak to drive to the pharmacy to buy any over-the-counter medications  Patient is a 21 y.o. female presenting with pharyngitis. The history is provided by the patient.  Sore Throat This is a new problem. The current episode started today. The problem occurs constantly. The problem has been unchanged. Associated symptoms include a fever, headaches, myalgias and a sore throat. Pertinent negatives include no congestion, coughing, nausea, neck pain or rash.    Past Medical History  Diagnosis Date  . Asthma     childhood  . Post partum depression     Past Surgical History  Procedure Date  . Abdominal surgery   . C secion   . Cesarean section     No family history on file.  History  Substance Use Topics  . Smoking status: Current Every Day Smoker -- 0.5 packs/day for 5 years    Types: Cigarettes  . Smokeless tobacco: Not on file  . Alcohol Use: No    OB History    Grav Para Term Preterm Abortions TAB SAB Ect Mult Living   1               Review of Systems  Constitutional: Positive for fever.  HENT: Positive for sore throat. Negative for congestion, trouble swallowing, neck pain and postnasal drip.   Respiratory: Negative for cough.   Gastrointestinal: Negative for nausea.  Musculoskeletal: Positive for myalgias.  Skin: Negative for rash and wound.  Neurological: Positive for headaches.    Allergies  Oysters  Home  Medications   Current Outpatient Rx  Name Route Sig Dispense Refill  . VITAMIN D3 5000 UNITS PO CAPS Oral Take 5,000 Units by mouth daily.    Marland Kitchen EPINEPHRINE 0.3 MG/0.3ML IJ DEVI Intramuscular Inject 0.3 mLs (0.3 mg total) into the muscle once. 1 Device 1  . FERROUS SULFATE 325 (65 FE) MG PO TABS Oral Take 325 mg by mouth daily with breakfast.    . MULTI-VITAMIN/MINERALS PO TABS Oral Take 1 tablet by mouth daily.      . IBUPROFEN 600 MG PO TABS Oral Take 1 tablet (600 mg total) by mouth every 6 (six) hours as needed for pain. 30 tablet 0    BP 134/78  Pulse 80  Temp 99.8 F (37.7 C) (Oral)  Resp 18  SpO2 99%  Breastfeeding? Unknown  Physical Exam  Constitutional: She is oriented to person, place, and time. She appears well-developed and well-nourished.  HENT:  Mouth/Throat: Uvula is midline. No posterior oropharyngeal edema or posterior oropharyngeal erythema.  Eyes: Pupils are equal, round, and reactive to light.  Neck: Normal range of motion.  Cardiovascular: Tachycardia present.   Pulmonary/Chest: Effort normal.  Abdominal: Soft.  Genitourinary: Vagina normal.  Musculoskeletal: Normal range of motion.  Neurological: She is alert and oriented to person, place, and time.  Skin: Skin is warm. No rash noted. No erythema.    ED Course  Procedures (including critical care time)  Labs Reviewed  RAPID STREP SCREEN   No results found.   1. Viral disease   2. Pharyngitis       MDM   Other than patient's heart rate being slightly elevated.  Her physical exam is benign without any overt evidence of strep throat.  Awaiting these rapid strep result        Arman Filter, NP 02/07/12 2244

## 2012-02-07 NOTE — ED Notes (Signed)
reswab for strep completed and walked to lab

## 2012-02-08 NOTE — ED Provider Notes (Signed)
Medical screening examination/treatment/procedure(s) were performed by non-physician practitioner and as supervising physician I was immediately available for consultation/collaboration.   Isador Castille, MD 02/08/12 0042 

## 2012-02-17 DIAGNOSIS — F17200 Nicotine dependence, unspecified, uncomplicated: Secondary | ICD-10-CM | POA: Insufficient documentation

## 2012-02-17 DIAGNOSIS — N899 Noninflammatory disorder of vagina, unspecified: Secondary | ICD-10-CM | POA: Insufficient documentation

## 2012-02-18 ENCOUNTER — Emergency Department (HOSPITAL_COMMUNITY)
Admission: EM | Admit: 2012-02-18 | Discharge: 2012-02-18 | Disposition: A | Discharge status: Home / Self Care | Payer: Managed Care, Other (non HMO) | Attending: Emergency Medicine | Admitting: Emergency Medicine

## 2012-02-18 ENCOUNTER — Encounter (HOSPITAL_COMMUNITY): Payer: Self-pay | Admitting: *Deleted

## 2012-02-18 DIAGNOSIS — N898 Other specified noninflammatory disorders of vagina: Secondary | ICD-10-CM

## 2012-02-18 LAB — WET PREP, GENITAL
Trich, Wet Prep: NONE SEEN
Yeast Wet Prep HPF POC: NONE SEEN

## 2012-02-18 NOTE — ED Notes (Signed)
The pt hashad vaginal swelling for 2-3 days and she feel a lump when she wipes.  lmp   Last week

## 2012-02-18 NOTE — ED Provider Notes (Signed)
History     CSN: [id number]  Arrival date & time 02/17/12  2358   First MD Initiated Contact with Patient 02/18/12 [phone]      Chief Complaint  Patient presents with  . vaginal  swelling     (Consider location/radiation/quality/duration/timing/severity/associated sxs/prior treatment) HPI 21 year old female presents emergency department complaining of a hard lump in her vagina, and discharge. She reports tonight she was going to the bathroom, and "something fell out". Patient describes it as a blood clot. When she reaches into her vagina, she feels something hard. She reports her mother took a look and felt that she may have a retained tampon. The last time she placed a tampon was the 16th. She denies any lower abdominal pain, no other complaints at this time. Past Medical History  Diagnosis Date  . Asthma     childhood  . Post partum depression     Past Surgical History  Procedure Date  . Abdominal surgery   . C secion   . Cesarean section     No family history on file.  History  Substance Use Topics  . Smoking status: Current Every Day Smoker -- 0.5 packs/day for 5 years    Types: Cigarettes  . Smokeless tobacco: Not on file  . Alcohol Use: No    OB History    Grav Para Term Preterm Abortions TAB SAB Ect Mult Living   1               Review of Systems  All other systems reviewed and are negative.    Allergies  Oysters  Home Medications   Current Outpatient Rx  Name Route Sig Dispense Refill  . VITAMIN D3 5000 UNITS PO CAPS Oral Take 5,000 Units by mouth daily.    Marland Kitchen EPINEPHRINE 0.3 MG/0.3ML IJ DEVI Intramuscular Inject 0.3 mLs (0.3 mg total) into the muscle once. 1 Device 1  . FERROUS SULFATE 325 (65 FE) MG PO TABS Oral Take 325 mg by mouth daily with breakfast.    . IBUPROFEN 600 MG PO TABS Oral Take 1 tablet (600 mg total) by mouth every 6 (six) hours as needed for pain. 30 tablet 0  . MULTI-VITAMIN/MINERALS PO TABS Oral Take 1 tablet by mouth daily.         BP 120/72  Pulse 102  Temp 99.7 F (37.6 C) (Oral)  Resp 20  SpO2 96%  LMP 01/18/2012  Physical Exam  Nursing note and vitals reviewed. Constitutional: She appears well-developed and well-nourished. No distress.  Abdominal: Soft. Bowel sounds are normal. She exhibits no distension and no mass. There is no tenderness. There is no rebound and no guarding.  Genitourinary: Uterus normal. Vaginal discharge found.       Vaginal discharge noted, brown/mucous.  Cervix friable, no ulcerations, but petechial lesions noted.  Os closed.  No FOB noted.  No vaginal bleeding.  No CMT, no uterine tenderness or masses, adnexal pain or mases  Musculoskeletal: Normal range of motion. She exhibits no edema and no tenderness.  Skin: Skin is warm and dry. No rash noted. No erythema. No pallor.    ED Course  Procedures (including critical care time)  Labs Reviewed  WET PREP, GENITAL - Abnormal; Notable for the following:    Clue Cells Wet Prep HPF POC FEW (*)     WBC, Wet Prep HPF POC MANY (*)     All other components within normal limits  GC/CHLAMYDIA PROBE AMP, GENITAL   No results found.  1. Vaginal irritation       MDM  21 year old female who reported a foreign object protruding from her vagina, none seen on physical exam, some discharge on exam sent for gonorrhea and Chlamydia and wet prep. Patient without significant pain on exam plan to hold on treatment for gonorrhea and Chlamydia at this time. Patient informed that her cervix appeared irritated on exam, strongly recommended that she followup with her gynecologist. She has not had a pelvic exam/pap smear in over 2 years.        Olivia Mackie, MD 02/18/12 [phone]

## 2012-02-19 LAB — GC/CHLAMYDIA PROBE AMP, GENITAL
Chlamydia, DNA Probe: NEGATIVE
GC Probe Amp, Genital: POSITIVE — AB

## 2012-02-20 ENCOUNTER — Telehealth (HOSPITAL_COMMUNITY): Payer: Self-pay | Admitting: Emergency Medicine

## 2012-02-20 NOTE — ED Notes (Signed)
Results received from [facility].  (+) Gonorrhea.  No antibiotic treatment or Prescription given for STD.  Chart to MD office for review.  [facility] form attached. 

## 2012-02-23 ENCOUNTER — Telehealth (HOSPITAL_COMMUNITY): Payer: Self-pay | Admitting: Emergency Medicine

## 2012-02-23 NOTE — ED Notes (Signed)
Chart returned from EDP office. Prescribed cefixime 400 mg PO x 1 and doxycycline 100 mg PO BID x 7 days. Follow-up with STD clinic. Prescribed/reviewed by Felicie Morn NPC.

## 2012-03-21 ENCOUNTER — Encounter (HOSPITAL_COMMUNITY): Payer: Self-pay | Admitting: Emergency Medicine

## 2012-03-21 ENCOUNTER — Emergency Department (HOSPITAL_COMMUNITY)
Admission: EM | Admit: 2012-03-21 | Discharge: 2012-03-21 | Disposition: A | Discharge status: Home / Self Care | Payer: Managed Care, Other (non HMO) | Attending: Emergency Medicine | Admitting: Emergency Medicine

## 2012-03-21 DIAGNOSIS — B9689 Other specified bacterial agents as the cause of diseases classified elsewhere: Secondary | ICD-10-CM

## 2012-03-21 DIAGNOSIS — F17200 Nicotine dependence, unspecified, uncomplicated: Secondary | ICD-10-CM | POA: Insufficient documentation

## 2012-03-21 DIAGNOSIS — J45909 Unspecified asthma, uncomplicated: Secondary | ICD-10-CM | POA: Insufficient documentation

## 2012-03-21 DIAGNOSIS — Z79899 Other long term (current) drug therapy: Secondary | ICD-10-CM | POA: Insufficient documentation

## 2012-03-21 DIAGNOSIS — N760 Acute vaginitis: Secondary | ICD-10-CM | POA: Insufficient documentation

## 2012-03-21 LAB — URINALYSIS, ROUTINE W REFLEX MICROSCOPIC
Bilirubin Urine: NEGATIVE
Glucose, UA: NEGATIVE mg/dL
Hgb urine dipstick: NEGATIVE
Ketones, ur: NEGATIVE mg/dL
Nitrite: NEGATIVE
Protein, ur: NEGATIVE mg/dL
Specific Gravity, Urine: 1.034 — ABNORMAL HIGH (ref 1.005–1.030)
Urobilinogen, UA: 1 mg/dL (ref 0.0–1.0)
pH: 7.5 (ref 5.0–8.0)

## 2012-03-21 LAB — URINE MICROSCOPIC-ADD ON

## 2012-03-21 LAB — WET PREP, GENITAL
Trich, Wet Prep: NONE SEEN
Yeast Wet Prep HPF POC: NONE SEEN

## 2012-03-21 LAB — POCT PREGNANCY, URINE: Preg Test, Ur: NEGATIVE

## 2012-03-21 MED ORDER — METRONIDAZOLE 500 MG PO TABS: 500.0000 mg | ORAL_TABLET | Freq: Two times a day (BID) | ORAL | Status: DC

## 2012-03-21 NOTE — ED Notes (Signed)
Reports having gonorrhea, finished meds, f/u with Dr Henderson Cloud- and checkup came back okay; reports now having vaginal irritation, reports pain when urinating; does report itch, denies abnormal discharge

## 2012-03-21 NOTE — ED Provider Notes (Signed)
Medical screening examination/treatment/procedure(s) were performed by non-physician practitioner and as supervising physician I was immediately available for consultation/collaboration.   Gorden Stthomas R Girtie Wiersma, MD 03/21/12 2306 

## 2012-03-21 NOTE — ED Provider Notes (Signed)
History     CSN: [id number]  Arrival date & time 03/21/12  1745   First MD Initiated Contact with Patient 03/21/12 2011      Chief Complaint  Patient presents with  . Vaginal Itching    (Consider location/radiation/quality/duration/timing/severity/associated sxs/prior treatment) Patient is a 21 y.o. female presenting with vaginal itching. The history is provided by the patient. No language interpreter was used.  Vaginal Itching This is a new problem. The current episode started yesterday. The problem occurs 2 to 4 times per day. The problem has been gradually worsening. Associated symptoms include a rash. Pertinent negatives include no urinary symptoms. She has tried nothing for the symptoms.    Past Medical History  Diagnosis Date  . Asthma     childhood  . Post partum depression     Past Surgical History  Procedure Date  . Abdominal surgery   . C secion   . Cesarean section     History reviewed. No pertinent family history.  History  Substance Use Topics  . Smoking status: Current Every Day Smoker -- 0.5 packs/day for 5 years    Types: Cigarettes  . Smokeless tobacco: Not on file  . Alcohol Use: No    OB History    Grav Para Term Preterm Abortions TAB SAB Ect Mult Living   1               Review of Systems  Genitourinary: Positive for vaginal pain. Negative for dysuria, urgency and pelvic pain.  Skin: Positive for rash.  All other systems reviewed and are negative.    Allergies  Oysters  Home Medications   Current Outpatient Rx  Name Route Sig Dispense Refill  . VITAMIN D3 5000 UNITS PO CAPS Oral Take 5,000 Units by mouth daily.    Marland Kitchen EPINEPHRINE 0.3 MG/0.3ML IJ DEVI Intramuscular Inject 0.3 mLs (0.3 mg total) into the muscle once. 1 Device 1  . FERROUS SULFATE 325 (65 FE) MG PO TABS Oral Take 325 mg by mouth daily with breakfast.    . MULTI-VITAMIN/MINERALS PO TABS Oral Take 1 tablet by mouth daily.      Marland Kitchen OVER THE COUNTER MEDICATION Oral Take 1  tablet by mouth 3 (three) times daily. Weight loss medication called "Ketone"      BP 101/69  Pulse 67  Temp 98.5 F (36.9 C)  Resp 20  SpO2 97%  LMP 02/28/2012  Physical Exam  Nursing note and vitals reviewed. Constitutional: She is oriented to person, place, and time. She appears well-developed and well-nourished.  HENT:  Head: Normocephalic.  Right Ear: External ear normal.  Left Ear: External ear normal.  Mouth/Throat: Oropharynx is clear and moist.  Eyes: Pupils are equal, round, and reactive to light.  Neck: Normal range of motion. Neck supple.  Cardiovascular: Normal rate and regular rhythm.   Pulmonary/Chest: Effort normal and breath sounds normal.  Abdominal: Soft. Bowel sounds are normal.  Genitourinary: Uterus normal. There is no rash or lesion on the right labia. There is no rash or lesion on the left labia. Cervix exhibits discharge. Cervix exhibits no motion tenderness. Right adnexum displays no mass, no tenderness and no fullness. Left adnexum displays no mass, no tenderness and no fullness. There is bleeding around the vagina.  Musculoskeletal: Normal range of motion.  Lymphadenopathy:    She has no cervical adenopathy.  Neurological: She is alert and oriented to person, place, and time.  Skin: Skin is warm and dry.  Psychiatric: She has a  normal mood and affect. Her behavior is normal. Judgment and thought content normal.    ED Course  Procedures (including critical care time)  Labs Reviewed  URINALYSIS, ROUTINE W REFLEX MICROSCOPIC - Abnormal; Notable for the following:    APPearance CLOUDY (*)     Specific Gravity, Urine 1.034 (*)     Leukocytes, UA SMALL (*)     All other components within normal limits  URINE MICROSCOPIC-ADD ON - Abnormal; Notable for the following:    Squamous Epithelial / LPF FEW (*)     Bacteria, UA FEW (*)     All other components within normal limits  POCT PREGNANCY, URINE   No results found.   No diagnosis  found.  Bacterial vaginitis--prescription for flagyl, 500 mg, bid x 7 days provided.  Patient to follow-up with her gynecologist Henderson Cloud).  MDM          Jimmye Norman, NP 03/21/12 2238

## 2012-03-22 LAB — GC/CHLAMYDIA PROBE AMP, GENITAL
Chlamydia, DNA Probe: NEGATIVE
GC Probe Amp, Genital: NEGATIVE

## 2012-08-17 ENCOUNTER — Ambulatory Visit (INDEPENDENT_AMBULATORY_CARE_PROVIDER_SITE_OTHER): Payer: Managed Care, Other (non HMO) | Admitting: Physician Assistant

## 2012-08-17 VITALS — BP 142/87 | HR 64 | Temp 99.3°F | Resp 18 | Ht 66.5 in | Wt 239.0 lb

## 2012-08-17 DIAGNOSIS — B9689 Other specified bacterial agents as the cause of diseases classified elsewhere: Secondary | ICD-10-CM

## 2012-08-17 DIAGNOSIS — N390 Urinary tract infection, site not specified: Secondary | ICD-10-CM

## 2012-08-17 DIAGNOSIS — N760 Acute vaginitis: Secondary | ICD-10-CM

## 2012-08-17 DIAGNOSIS — R300 Dysuria: Secondary | ICD-10-CM

## 2012-08-17 DIAGNOSIS — Z113 Encounter for screening for infections with a predominantly sexual mode of transmission: Secondary | ICD-10-CM

## 2012-08-17 LAB — POCT URINALYSIS DIPSTICK
Bilirubin, UA: NEGATIVE
Blood, UA: NEGATIVE
Glucose, UA: NEGATIVE
Nitrite, UA: NEGATIVE
Protein, UA: NEGATIVE
Spec Grav, UA: 1.025
Urobilinogen, UA: 0.2
pH, UA: 6

## 2012-08-17 LAB — POCT UA - MICROSCOPIC ONLY
Casts, Ur, LPF, POC: NEGATIVE
Crystals, Ur, HPF, POC: NEGATIVE
Yeast, UA: NEGATIVE

## 2012-08-17 LAB — POCT WET PREP WITH KOH
Clue Cells Wet Prep HPF POC: 100
KOH Prep POC: NEGATIVE
RBC Wet Prep HPF POC: NEGATIVE
Trichomonas, UA: NEGATIVE
Yeast Wet Prep HPF POC: NEGATIVE

## 2012-08-17 MED ORDER — METRONIDAZOLE 500 MG PO TABS: 500.0000 mg | ORAL_TABLET | Freq: Two times a day (BID) | ORAL | Status: DC

## 2012-08-17 MED ORDER — NITROFURANTOIN MONOHYD MACRO 100 MG PO CAPS: 100.0000 mg | ORAL_CAPSULE | Freq: Two times a day (BID) | ORAL | Status: DC

## 2012-08-17 NOTE — Patient Instructions (Addendum)
Begin taking the Macrobid (nitrofurantoin) and Flagyl (metronidazole) as directed.  Be sure to finish the full course.  DO NOT CONSUME ALCOHOL WHILE YOU ARE TAKING THE METRONIDAZOLE OR FOR 48 HOURS AFTER YOUR LAST DOSE.  I will let you know when the rest of your labs are back.    Urinary Tract Infection Urinary tract infections (UTIs) can develop anywhere along your urinary tract. Your urinary tract is your body's drainage system for removing wastes and extra water. Your urinary tract includes two kidneys, two ureters, a bladder, and a urethra. Your kidneys are a pair of bean-shaped organs. Each kidney is about the size of your fist. They are located below your ribs, one on each side of your spine. CAUSES Infections are caused by microbes, which are microscopic organisms, including fungi, viruses, and bacteria. These organisms are so small that they can only be seen through a microscope. Bacteria are the microbes that most commonly cause UTIs. SYMPTOMS  Symptoms of UTIs may vary by age and gender of the patient and by the location of the infection. Symptoms in young women typically include a frequent and intense urge to urinate and a painful, burning feeling in the bladder or urethra during urination. Older women and men are more likely to be tired, shaky, and weak and have muscle aches and abdominal pain. A fever may mean the infection is in your kidneys. Other symptoms of a kidney infection include pain in your back or sides below the ribs, nausea, and vomiting. DIAGNOSIS To diagnose a UTI, your caregiver will ask you about your symptoms. Your caregiver also will ask to provide a urine sample. The urine sample will be tested for bacteria and white blood cells. White blood cells are made by your body to help fight infection. TREATMENT  Typically, UTIs can be treated with medication. Because most UTIs are caused by a bacterial infection, they usually can be treated with the use of antibiotics. The choice  of antibiotic and length of treatment depend on your symptoms and the type of bacteria causing your infection. HOME CARE INSTRUCTIONS  If you were prescribed antibiotics, take them exactly as your caregiver instructs you. Finish the medication even if you feel better after you have only taken some of the medication.  Drink enough water and fluids to keep your urine clear or pale yellow.  Avoid caffeine, tea, and carbonated beverages. They tend to irritate your bladder.  Empty your bladder often. Avoid holding urine for long periods of time.  Empty your bladder before and after sexual intercourse.  After a bowel movement, women should cleanse from front to back. Use each tissue only once. SEEK MEDICAL CARE IF:   You have back pain.  You develop a fever.  Your symptoms do not begin to resolve within 3 days. SEEK IMMEDIATE MEDICAL CARE IF:   You have severe back pain or lower abdominal pain.  You develop chills.  You have nausea or vomiting.  You have continued burning or discomfort with urination. MAKE SURE YOU:   Understand these instructions.  Will watch your condition.  Will get help right away if you are not doing well or get worse. Document Released: 02/21/2005 Document Revised: 11/13/2011 Document Reviewed: 06/22/2011 [facility] Patient Information 2013 [place], [place].   Bacterial Vaginosis Bacterial vaginosis (BV) is a vaginal infection where the normal balance of bacteria in the vagina is disrupted. The normal balance is then replaced by an overgrowth of certain bacteria. There are several different kinds of bacteria that  can cause BV. BV is the most common vaginal infection in women of childbearing age. CAUSES   The cause of BV is not fully understood. BV develops when there is an increase or imbalance of harmful bacteria.  Some activities or behaviors can upset the normal balance of bacteria in the vagina and put women at increased risk including:  Having a new  sex partner or multiple sex partners.  Douching.  Using an intrauterine device (IUD) for contraception.  It is not clear what role sexual activity plays in the development of BV. However, women that have never had sexual intercourse are rarely infected with BV. Women do not get BV from toilet seats, bedding, swimming pools or from touching objects around them.  SYMPTOMS   Grey vaginal discharge.  A fish-like odor with discharge, especially after sexual intercourse.  Itching or burning of the vagina and vulva.  Burning or pain with urination.  Some women have no signs or symptoms at all. DIAGNOSIS  Your caregiver must examine the vagina for signs of BV. Your caregiver will perform lab tests and look at the sample of vaginal fluid through a microscope. They will look for bacteria and abnormal cells (clue cells), a pH test higher than 4.5, and a positive amine test all associated with BV.  RISKS AND COMPLICATIONS   Pelvic inflammatory disease (PID).  Infections following gynecology surgery.  Developing HIV.  Developing herpes virus. TREATMENT  Sometimes BV will clear up without treatment. However, all women with symptoms of BV should be treated to avoid complications, especially if gynecology surgery is planned. Female partners generally do not need to be treated. However, BV may spread between female sex partners so treatment is helpful in preventing a recurrence of BV.   BV may be treated with antibiotics. The antibiotics come in either pill or vaginal cream forms. Either can be used with nonpregnant or pregnant women, but the recommended dosages differ. These antibiotics are not harmful to the baby.  BV can recur after treatment. If this happens, a second round of antibiotics will often be prescribed.  Treatment is important for pregnant women. If not treated, BV can cause a premature delivery, especially for a pregnant woman who had a premature birth in the past. All pregnant  women who have symptoms of BV should be checked and treated.  For chronic reoccurrence of BV, treatment with a type of prescribed gel vaginally twice a week is helpful. HOME CARE INSTRUCTIONS   Finish all medication as directed by your caregiver.  Do not have sex until treatment is completed.  Tell your sexual partner that you have a vaginal infection. They should see their caregiver and be treated if they have problems, such as a mild rash or itching.  Practice safe sex. Use condoms. Only have 1 sex partner. PREVENTION  Basic prevention steps can help reduce the risk of upsetting the natural balance of bacteria in the vagina and developing BV:  Do not have sexual intercourse (be abstinent).  Do not douche.  Use all of the medicine prescribed for treatment of BV, even if the signs and symptoms go away.  Tell your sex partner if you have BV. That way, they can be treated, if needed, to prevent reoccurrence. SEEK MEDICAL CARE IF:   Your symptoms are not improving after 3 days of treatment.  You have increased discharge, pain, or fever. MAKE SURE YOU:   Understand these instructions.  Will watch your condition.  Will get help right away if  you are not doing well or get worse. FOR MORE INFORMATION  Division of STD Prevention (DSTDP), Centers for Disease Control and Prevention: [employer] American [employer] Association (ASHA): www.ashastd.org  Document Released: 05/14/2005 Document Revised: 08/06/2011 Document Reviewed: 11/04/2008 [facility] Patient Information 2013 [place], [place].

## 2012-08-17 NOTE — Progress Notes (Signed)
Subjective:    Patient ID: Barbara Zimmerman, female    DOB: 08-Jun-1990, 22 y.o.   MRN: [id number]  HPI   Barbara Zimmerman is a 22 yr old female here with concern for illness. States she has had burning and sharp pain with urination for the last 2-3 days.  No abd pain or back pain.  No fever, chills, nausea, vomiting.  Not sure if she has had any hematuria as she has also been on her period.  She has never had a UTI before.  Has also had a little more vaginal discharge than usual.  Describes the discharge as watery.  Also having some itching for the last 2-3 days as well.  Has had yeast infections in the past, not sure if she has had BV.  Would like to be tested for STIs today.  Has tested positive for gonorrhea in the past.  Currently has just one sexual partner.  She is not using condoms.  Not using any form of birth control.  She is not trying to get pregnant.     Review of Systems  Constitutional: Negative for fever and chills.  HENT: Negative.   Respiratory: Positive for apnea.   Cardiovascular: Negative.   Gastrointestinal: Negative for nausea, vomiting and abdominal pain.  Genitourinary: Positive for dysuria and vaginal discharge. Negative for frequency and hematuria.  Musculoskeletal: Negative.   Neurological: Negative.        Objective:   Physical Exam  Vitals reviewed. Constitutional: She is oriented to person, place, and time. She appears well-developed and well-nourished. No distress.  HENT:  Head: Normocephalic and atraumatic.  Eyes: Conjunctivae are normal. No scleral icterus.  Cardiovascular: Normal rate, regular rhythm and normal heart sounds.   Pulmonary/Chest: Effort normal and breath sounds normal.  Abdominal: Soft. Bowel sounds are normal. She exhibits no distension and no mass. There is no tenderness. There is no rebound and no guarding.  Genitourinary: Uterus normal. There is no rash, tenderness or lesion on the right labia. There is no rash, tenderness or lesion on the left  labia. Cervix exhibits no motion tenderness, no discharge and no friability. Right adnexum displays no mass, no tenderness and no fullness. Left adnexum displays no mass, no tenderness and no fullness. Vaginal discharge (thin, white) found.  Neurological: She is alert and oriented to person, place, and time.  Skin: Skin is warm and dry.  Psychiatric: She has a normal mood and affect. Her behavior is normal.     Filed Vitals:   08/17/12 1804  BP: 142/87  Pulse: 64  Temp: 99.3 F (37.4 C)  Resp: 18      Results for orders placed in visit on 08/17/12  POCT UA - MICROSCOPIC ONLY      Result Value Range   WBC, Ur, HPF, POC 10-15     RBC, urine, microscopic 0-1     Bacteria, U Microscopic trace     Mucus, UA trace     Epithelial cells, urine per micros 0-2     Crystals, Ur, HPF, POC neg     Casts, Ur, LPF, POC neg     Yeast, UA neg    POCT URINALYSIS DIPSTICK      Result Value Range   Color, UA yellow     Clarity, UA hazy     Glucose, UA neg     Bilirubin, UA neg     Ketones, UA trace     Spec Grav, UA 1.025     Blood,  UA neg     pH, UA 6.0     Protein, UA neg     Urobilinogen, UA 0.2     Nitrite, UA neg     Leukocytes, UA small (1+)    POCT WET PREP WITH KOH      Result Value Range   Trichomonas, UA Negative     Clue Cells Wet Prep HPF POC 100%     Epithelial Wet Prep HPF POC 2-10     Yeast Wet Prep HPF POC neg     Bacteria Wet Prep HPF POC 3+     RBC Wet Prep HPF POC neg     WBC Wet Prep HPF POC 0-2     KOH Prep POC Negative         Assessment & Plan:  UTI (urinary tract infection) - Plan: nitrofurantoin, macrocrystal-monohydrate, (MACROBID) 100 MG capsule  -- Pt with several days of dysuria.  UA with +leuks.  Will treat for UTI with 5 days of Macrobid.  Urine cx sent.  Will adjust therapy if necessary based on culture results.   Bacterial vaginosis - Plan: metroNIDAZOLE (FLAGYL) 500 MG tablet  -- Wet prep reveals 100% clue cells.  Will treat with Flagyl x 7  days.  Discussed avoidance of etoh.  Pt very hesitant about this but understands and is in agreement.    Dysuria - Plan: POCT UA - Microscopic Only, POCT urinalysis dipstick, Urine culture  -- See above.  Possible that this is due both to UTI and BV.    Screen for STD (sexually transmitted disease) - Plan: GC/Chlamydia Probe Amp, POCT Wet Prep with KOH  -- Pt requests STD testing today.  Genprobe collected.  Will treat if necessary when labs return.  Encouraged condom use with every sexual encounter.

## 2012-08-19 LAB — GC/CHLAMYDIA PROBE AMP
CT Probe RNA: NEGATIVE
GC Probe RNA: NEGATIVE

## 2012-08-20 LAB — URINE CULTURE
Colony Count: NO GROWTH
Organism ID, Bacteria: NO GROWTH

## 2014-02-04 ENCOUNTER — Emergency Department (HOSPITAL_COMMUNITY): Payer: Managed Care, Other (non HMO)

## 2014-02-04 ENCOUNTER — Emergency Department (HOSPITAL_COMMUNITY)
Admission: EM | Admit: 2014-02-04 | Discharge: 2014-02-05 | Disposition: A | Discharge status: Home / Self Care | Payer: Managed Care, Other (non HMO) | Attending: Emergency Medicine | Admitting: Emergency Medicine

## 2014-02-04 ENCOUNTER — Encounter (HOSPITAL_COMMUNITY): Payer: Self-pay | Admitting: Emergency Medicine

## 2014-02-04 DIAGNOSIS — Z8659 Personal history of other mental and behavioral disorders: Secondary | ICD-10-CM | POA: Insufficient documentation

## 2014-02-04 DIAGNOSIS — Z79899 Other long term (current) drug therapy: Secondary | ICD-10-CM | POA: Diagnosis not present

## 2014-02-04 DIAGNOSIS — S8991XA Unspecified injury of right lower leg, initial encounter: Secondary | ICD-10-CM

## 2014-02-04 DIAGNOSIS — R296 Repeated falls: Secondary | ICD-10-CM | POA: Insufficient documentation

## 2014-02-04 DIAGNOSIS — F17200 Nicotine dependence, unspecified, uncomplicated: Secondary | ICD-10-CM | POA: Diagnosis not present

## 2014-02-04 DIAGNOSIS — Y9289 Other specified places as the place of occurrence of the external cause: Secondary | ICD-10-CM | POA: Insufficient documentation

## 2014-02-04 DIAGNOSIS — S99929A Unspecified injury of unspecified foot, initial encounter: Secondary | ICD-10-CM | POA: Diagnosis not present

## 2014-02-04 DIAGNOSIS — S8990XA Unspecified injury of unspecified lower leg, initial encounter: Secondary | ICD-10-CM | POA: Insufficient documentation

## 2014-02-04 DIAGNOSIS — S99919A Unspecified injury of unspecified ankle, initial encounter: Principal | ICD-10-CM

## 2014-02-04 DIAGNOSIS — J45909 Unspecified asthma, uncomplicated: Secondary | ICD-10-CM | POA: Insufficient documentation

## 2014-02-04 DIAGNOSIS — Z792 Long term (current) use of antibiotics: Secondary | ICD-10-CM | POA: Insufficient documentation

## 2014-02-04 DIAGNOSIS — Y9341 Activity, dancing: Secondary | ICD-10-CM | POA: Diagnosis not present

## 2014-02-04 DIAGNOSIS — W19XXXA Unspecified fall, initial encounter: Secondary | ICD-10-CM

## 2014-02-04 MED ORDER — NAPROXEN 500 MG PO TABS: 500.0000 mg | ORAL_TABLET | Freq: Two times a day (BID) | ORAL | Status: DC

## 2014-02-04 MED ORDER — HYDROCODONE-ACETAMINOPHEN 5-325 MG PO TABS: 1.0000 | ORAL_TABLET | Freq: Four times a day (QID) | ORAL | Status: DC | PRN

## 2014-02-04 MED ORDER — HYDROCODONE-ACETAMINOPHEN 5-325 MG PO TABS
1.0000 | ORAL_TABLET | Freq: Once | ORAL | Status: AC
  Administered 2014-02-04: 1 via ORAL
  Filled 2014-02-04: qty 1
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
## 2014-02-04 NOTE — ED Notes (Signed)
Pt. lost her balance and fell while dancing last Tuesday , no LOC /ambulatory , reports right thigh muscle ache .

## 2014-02-08 NOTE — ED Provider Notes (Signed)
Medical screening examination/treatment/procedure(s) were performed by non-physician practitioner and as supervising physician I was immediately available for consultation/collaboration.   EKG Interpretation None        Linde Wilensky N Keylen Uzelac, DO 02/08/14 1016 

## 2014-03-21 ENCOUNTER — Emergency Department (HOSPITAL_COMMUNITY)
Admission: EM | Admit: 2014-03-21 | Discharge: 2014-03-21 | Disposition: A | Discharge status: Home / Self Care | Payer: Managed Care, Other (non HMO) | Attending: Emergency Medicine | Admitting: Emergency Medicine

## 2014-03-21 ENCOUNTER — Encounter (HOSPITAL_COMMUNITY): Payer: Self-pay | Admitting: Emergency Medicine

## 2014-03-21 ENCOUNTER — Emergency Department (HOSPITAL_COMMUNITY): Payer: No Typology Code available for payment source

## 2014-03-21 DIAGNOSIS — J45909 Unspecified asthma, uncomplicated: Secondary | ICD-10-CM | POA: Insufficient documentation

## 2014-03-21 DIAGNOSIS — S0990XA Unspecified injury of head, initial encounter: Secondary | ICD-10-CM | POA: Insufficient documentation

## 2014-03-21 DIAGNOSIS — Y92410 Unspecified street and highway as the place of occurrence of the external cause: Secondary | ICD-10-CM | POA: Insufficient documentation

## 2014-03-21 DIAGNOSIS — G4489 Other headache syndrome: Secondary | ICD-10-CM

## 2014-03-21 DIAGNOSIS — Z720 Tobacco use: Secondary | ICD-10-CM | POA: Insufficient documentation

## 2014-03-21 DIAGNOSIS — Z79899 Other long term (current) drug therapy: Secondary | ICD-10-CM | POA: Insufficient documentation

## 2014-03-21 DIAGNOSIS — Z8659 Personal history of other mental and behavioral disorders: Secondary | ICD-10-CM | POA: Insufficient documentation

## 2014-03-21 DIAGNOSIS — Y9389 Activity, other specified: Secondary | ICD-10-CM | POA: Insufficient documentation

## 2014-03-21 MED ORDER — ACETAMINOPHEN 500 MG PO TABS
1000.0000 mg | ORAL_TABLET | Freq: Once | ORAL | Status: AC
  Administered 2014-03-21: 1000 mg via ORAL
  Filled 2014-03-21: qty 2

## 2014-03-21 MED ORDER — TRAMADOL HCL 50 MG PO TABS
50.0000 mg | ORAL_TABLET | Freq: Four times a day (QID) | ORAL | Status: DC | PRN
Start: 2014-03-21 — End: 2015-02-05

## 2014-03-21 MED ORDER — CYCLOBENZAPRINE HCL 10 MG PO TABS: 10.0000 mg | ORAL_TABLET | Freq: Two times a day (BID) | ORAL | Status: DC | PRN

## 2014-03-21 NOTE — ED Notes (Signed)
MD at bedside. 

## 2014-03-21 NOTE — Discharge Instructions (Signed)
Take Tramadol as needed for pain. Take Flexeril as needed for muscle spasm. Refer to attached documents for more information.  °
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
## 2014-03-21 NOTE — ED Notes (Addendum)
Enter in error
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
## 2014-03-23 NOTE — ED Provider Notes (Signed)
Medical screening examination/treatment/procedure(s) were performed by non-physician practitioner and as supervising physician I was immediately available for consultation/collaboration.   EKG Interpretation None        Jotham Ahn, MD 03/23/14 0924 

## 2014-03-29 ENCOUNTER — Encounter (HOSPITAL_COMMUNITY): Payer: Self-pay | Admitting: Emergency Medicine

## 2014-06-23 ENCOUNTER — Other Ambulatory Visit: Payer: Self-pay | Admitting: Obstetrics and Gynecology

## 2014-06-24 LAB — CYTOLOGY - PAP

## 2015-01-08 ENCOUNTER — Ambulatory Visit (INDEPENDENT_AMBULATORY_CARE_PROVIDER_SITE_OTHER): Payer: Managed Care, Other (non HMO) | Admitting: Physician Assistant

## 2015-01-08 VITALS — BP 108/70 | HR 63 | Temp 98.0°F | Resp 18 | Ht 66.5 in | Wt 250.0 lb

## 2015-01-08 DIAGNOSIS — N3010 Interstitial cystitis (chronic) without hematuria: Secondary | ICD-10-CM | POA: Diagnosis not present

## 2015-01-08 DIAGNOSIS — R350 Frequency of micturition: Secondary | ICD-10-CM

## 2015-01-08 LAB — POCT WET PREP WITH KOH
Clue Cells Wet Prep HPF POC: NEGATIVE
KOH Prep POC: NEGATIVE
Trichomonas, UA: NEGATIVE
Yeast Wet Prep HPF POC: NEGATIVE

## 2015-01-08 LAB — POCT URINALYSIS DIPSTICK
Glucose, UA: NEGATIVE
Ketones, UA: 15
Leukocytes, UA: NEGATIVE
Nitrite, UA: NEGATIVE
Protein, UA: 30
Spec Grav, UA: >=1.03
Urobilinogen, UA: 0.2
pH, UA: 6

## 2015-01-08 LAB — POCT UA - MICROSCOPIC ONLY
Bacteria, U Microscopic: NEGATIVE
Casts, Ur, LPF, POC: NEGATIVE
Crystals, Ur, HPF, POC: NEGATIVE
Mucus, UA: POSITIVE
Yeast, UA: NEGATIVE

## 2015-01-08 MED ORDER — PHENAZOPYRIDINE HCL 200 MG PO TABS: 200.0000 mg | ORAL_TABLET | Freq: Three times a day (TID) | ORAL | Status: DC | PRN

## 2015-01-08 NOTE — Progress Notes (Signed)
Barbara Zimmerman  MRN: [id number] DOB: [birth year]  Subjective:  Pt presents to clinic with urinary urgency and frequency and slight dysuria that started last night and seems to be getting worse today. She is having no vaginal discharge and is sexually active but has not has change in sexual partners and is not concerned about STDs.  LMP 8/11  There are no active problems to display for this patient.   Current Outpatient Prescriptions on File Prior to Visit  Medication Sig Dispense Refill  . EPINEPHrine (EPIPEN 2-PAK) 0.3 mg/0.3 mL IJ SOAJ injection Inject 0.3 mg into the muscle daily as needed. Allergic reaction    . cyclobenzaprine (FLEXERIL) 10 MG tablet Take 1 tablet (10 mg total) by mouth 2 (two) times daily as needed for muscle spasms. (Patient not taking: Reported on 01/08/2015) 20 tablet 0  . ibuprofen (ADVIL,MOTRIN) 200 MG tablet Take 600 mg by mouth every 6 (six) hours as needed for moderate pain.    . Multiple Vitamins-Minerals (HAIR/SKIN/NAILS PO) Take 1 tablet by mouth daily.    . Multiple Vitamins-Minerals (MULTIVITAMIN WITH MINERALS) tablet Take 1 tablet by mouth daily.      . traMADol (ULTRAM) 50 MG tablet Take 1 tablet (50 mg total) by mouth every 6 (six) hours as needed. (Patient not taking: Reported on 01/08/2015) 15 tablet 0   No current facility-administered medications on file prior to visit.    Allergies  Allergen Reactions  . Oysters [Shellfish Allergy] Swelling    clams    Review of Systems  Constitutional: Negative for fever and chills.  Gastrointestinal: Positive for abdominal pain (suprpapubic pain). Negative for nausea.  Genitourinary: Positive for dysuria, urgency and frequency. Negative for hematuria, flank pain and vaginal discharge.  Musculoskeletal: Negative for back pain.   Objective:  BP 108/70 mmHg  Pulse 63  Temp(Src) 98 F (36.7 C) (Oral)  Resp 18  Ht 5' 6.5" (1.689 m)  Wt 250 lb (113.399 kg)  BMI 39.75 kg/m2  SpO2 99%  LMP  01/06/2015  Physical Exam  Constitutional: She is oriented to person, place, and time and well-developed, well-nourished, and in no distress.  HENT:  Head: Normocephalic and atraumatic.  Right Ear: External ear normal.  Left Ear: External ear normal.  Cardiovascular: Normal rate, regular rhythm and normal heart sounds.   No murmur heard. Pulmonary/Chest: Effort normal and breath sounds normal.  Abdominal: Soft. There is tenderness in the suprapubic area. There is no CVA tenderness.  Genitourinary: Cervix normal. Red and vaginal discharge (on menses) found.  Neurological: She is alert and oriented to person, place, and time. Gait normal.  Skin: Skin is warm and dry.  Psychiatric: Mood, memory, affect and judgment normal.  Vitals reviewed.   Results for orders placed or performed in visit on 01/08/15  POCT UA - Microscopic Only  Result Value Ref Range   WBC, Ur, HPF, POC 0-2    RBC, urine, microscopic 0-2    Bacteria, U Microscopic neg    Mucus, UA positive    Epithelial cells, urine per micros 0-4    Crystals, Ur, HPF, POC neg    Casts, Ur, LPF, POC neg    Yeast, UA neg   POCT urinalysis dipstick  Result Value Ref Range   Color, UA yellow    Clarity, UA clear    Glucose, UA neg    Bilirubin, UA moderate    Ketones, UA 15    Spec Grav, UA >=1.030    Blood, UA trace-intact  pH, UA 6.0    Protein, UA 30    Urobilinogen, UA 0.2    Nitrite, UA neg    Leukocytes, UA Negative Negative  POCT Wet Prep with KOH  Result Value Ref Range   Trichomonas, UA Negative    Clue Cells Wet Prep HPF POC neg    Epithelial Wet Prep HPF POC Many Few, Moderate, Many   Yeast Wet Prep HPF POC neg    Bacteria Wet Prep HPF POC None None, Few   RBC Wet Prep HPF POC TNTC    WBC Wet Prep HPF POC 6-8    KOH Prep POC Negative     Assessment and Plan :  Urinary frequency - Plan: POCT UA - Microscopic Only, POCT urinalysis dipstick, Urine culture, POCT Wet Prep with KOH, phenazopyridine  (PYRIDIUM) 200 MG tablet  Interstitial cystitis   Pt does not have labs to indicate UTI currently so we will treat her symptoms while waiting on her urine culture.  She does not have BV either at this time.  She does have concentrated urine and that with her menses it is a possible cause of her symptoms.  Benny Lennert PA-C  [facility] 01/08/2015 3:54 PM

## 2015-01-09 LAB — URINE CULTURE: Colony Count: 50000

## 2015-02-05 ENCOUNTER — Ambulatory Visit (INDEPENDENT_AMBULATORY_CARE_PROVIDER_SITE_OTHER): Payer: Managed Care, Other (non HMO) | Admitting: Physician Assistant

## 2015-02-05 VITALS — BP 106/68 | HR 62 | Temp 97.8°F | Resp 18 | Ht 65.5 in | Wt 250.6 lb

## 2015-02-05 DIAGNOSIS — A499 Bacterial infection, unspecified: Secondary | ICD-10-CM

## 2015-02-05 DIAGNOSIS — R109 Unspecified abdominal pain: Secondary | ICD-10-CM | POA: Diagnosis not present

## 2015-02-05 DIAGNOSIS — N949 Unspecified condition associated with female genital organs and menstrual cycle: Secondary | ICD-10-CM

## 2015-02-05 DIAGNOSIS — N760 Acute vaginitis: Secondary | ICD-10-CM | POA: Diagnosis not present

## 2015-02-05 DIAGNOSIS — B9689 Other specified bacterial agents as the cause of diseases classified elsewhere: Secondary | ICD-10-CM

## 2015-02-05 LAB — POCT UA - MICROSCOPIC ONLY
Casts, Ur, LPF, POC: NEGATIVE
Crystals, Ur, HPF, POC: NEGATIVE
Mucus, UA: NEGATIVE
Yeast, UA: NEGATIVE

## 2015-02-05 LAB — POCT URINALYSIS DIPSTICK
Bilirubin, UA: NEGATIVE
Glucose, UA: NEGATIVE
Ketones, UA: NEGATIVE
Leukocytes, UA: NEGATIVE
Nitrite, UA: NEGATIVE
Protein, UA: NEGATIVE
Spec Grav, UA: 1.025
Urobilinogen, UA: 0.2
pH, UA: 7

## 2015-02-05 LAB — POCT CBC
Granulocyte percent: 56.7 %G (ref 37–80)
HCT, POC: 41.3 % (ref 37.7–47.9)
Hemoglobin: 12.8 g/dL (ref 12.2–16.2)
Lymph, poc: 1.9 (ref 0.6–3.4)
MCH, POC: 28.5 pg (ref 27–31.2)
MCHC: 30.9 g/dL — AB (ref 31.8–35.4)
MCV: 92.3 fL (ref 80–97)
MID (cbc): 0.4 (ref 0–0.9)
MPV: 7 fL (ref 0–99.8)
POC Granulocyte: 2.9 (ref 2–6.9)
POC LYMPH PERCENT: 36.3 %L (ref 10–50)
POC MID %: 7 %M (ref 0–12)
Platelet Count, POC: 256 10*3/uL (ref 142–424)
RBC: 4.48 M/uL (ref 4.04–5.48)
RDW, POC: 15.9 %
WBC: 5.1 10*3/uL (ref 4.6–10.2)

## 2015-02-05 LAB — POCT WET PREP WITH KOH
KOH Prep POC: NEGATIVE
Trichomonas, UA: NEGATIVE
Yeast Wet Prep HPF POC: NEGATIVE

## 2015-02-05 MED ORDER — METRONIDAZOLE 500 MG PO TABS: 500.0000 mg | ORAL_TABLET | Freq: Two times a day (BID) | ORAL | Status: DC

## 2015-02-05 NOTE — Progress Notes (Signed)
Subjective:    Patient ID: Barbara Zimmerman, female    DOB: 1991/04/17, 24 y.o.   MRN: [id number]  HPI Patient presents for vaginal discomfort and abdominal pain that have been present since yesterday. States that vagina feels swollen and is not painful, but doesn't feel normal. Denies vaginal discharge, bleeding, or itch. Denies urinary or bowel sx. Abdominal pain is crampy in quality, but more intense than when having menstrual cramps. Pain is all over, but mostly on left side. Denies fever, flatulence, decrease appetite, N/V, diarrhea, or constipation. Is not sexually active, however, uses toys and cleans them with soap and water. Last pap normal. Last pap 01/06/15 was normal as well. Used yeast cream on labia, but that did not improve discomfort. Abdominal surgery includes C-section of 24 year old twins.   Review of Systems  Constitutional: Negative for fever, chills, diaphoresis and appetite change.  Gastrointestinal: Positive for abdominal pain. Negative for nausea, vomiting, diarrhea, constipation, blood in stool and abdominal distention.  Genitourinary: Positive for vaginal pain. Negative for dysuria, urgency, frequency, hematuria, flank pain, decreased urine volume, vaginal bleeding, vaginal discharge, difficulty urinating, menstrual problem and pelvic pain.  Musculoskeletal: Negative for back pain.  Neurological: Negative.        Objective:   Physical Exam  Constitutional: She is oriented to person, place, and time. She appears well-developed and well-nourished. No distress.  Blood pressure 106/68, pulse 62, temperature 97.8 F (36.6 C), temperature source Oral, resp. rate 18, height 5' 5.5" (1.664 m), weight 250 lb 9.6 oz (113.671 kg), last menstrual period 01/06/2015, SpO2 99 %.   HENT:  Head: Normocephalic and atraumatic.  Right Ear: External ear normal.  Left Ear: External ear normal.  Eyes: Conjunctivae are normal. Right eye exhibits no discharge. Left eye exhibits no  discharge. No scleral icterus.  Neck: Normal range of motion. Neck supple.  Cardiovascular: Normal rate, regular rhythm and normal heart sounds.  Exam reveals no gallop and no friction rub.   No murmur heard. Pulmonary/Chest: Effort normal and breath sounds normal. No respiratory distress. She has no wheezes. She has no rales.  Abdominal: Soft. Bowel sounds are normal. She exhibits no distension and no mass. There is no hepatosplenomegaly. There is tenderness in the suprapubic area and left lower quadrant. There is no rigidity, no rebound, no guarding, no CVA tenderness, no tenderness at McBurney's point and negative Murphy's sign. No hernia.  Genitourinary: Uterus normal. No labial fusion. There is no rash, tenderness, lesion or injury on the right labia. There is no rash, tenderness, lesion or injury on the left labia. No erythema, tenderness or bleeding in the vagina. No foreign body around the vagina. No signs of injury around the vagina. Vaginal discharge (white, thin discharge) found.  Lymphadenopathy:    She has no cervical adenopathy.  Neurological: She is alert and oriented to person, place, and time.  Skin: Skin is warm and dry. She is not diaphoretic. No erythema.  Psychiatric: She has a normal mood and affect. Her behavior is normal. Judgment and thought content normal.    Results for orders placed or performed in visit on 02/05/15  POCT CBC  Result Value Ref Range   WBC 5.1 4.6 - 10.2 K/uL   Lymph, poc 1.9 0.6 - 3.4   POC LYMPH PERCENT 36.3 10 - 50 %L   MID (cbc) 0.4 0 - 0.9   POC MID % 7.0 0 - 12 %M   POC Granulocyte 2.9 2 - 6.9   Granulocyte  percent 56.7 37 - 80 %G   RBC 4.48 4.04 - 5.48 M/uL   Hemoglobin 12.8 12.2 - 16.2 g/dL   HCT, POC 96.0 45.4 - 47.9 %   MCV 92.3 80 - 97 fL   MCH, POC 28.5 27 - 31.2 pg   MCHC 30.9 (A) 31.8 - 35.4 g/dL   RDW, POC 09.8 %   Platelet Count, POC 256 142 - 424 K/uL   MPV 7.0 0 - 99.8 fL  POCT UA - Microscopic Only  Result Value Ref  Range   WBC, Ur, HPF, POC 1-2    RBC, urine, microscopic 0-2    Bacteria, U Microscopic trace    Mucus, UA neg    Epithelial cells, urine per micros 1-3    Crystals, Ur, HPF, POC neg    Casts, Ur, LPF, POC neg    Yeast, UA neg   POCT urinalysis dipstick  Result Value Ref Range   Color, UA yellow    Clarity, UA clear    Glucose, UA neg    Bilirubin, UA neg    Ketones, UA neg    Spec Grav, UA 1.025    Blood, UA trace    pH, UA 7.0    Protein, UA neg    Urobilinogen, UA 0.2    Nitrite, UA neg    Leukocytes, UA Negative Negative  POCT Wet Prep with KOH  Result Value Ref Range   Trichomonas, UA Negative    Clue Cells Wet Prep HPF POC 3-5    Epithelial Wet Prep HPF POC Moderate Few, Moderate, Many   Yeast Wet Prep HPF POC neg    Bacteria Wet Prep HPF POC Moderate (A) None, Few   RBC Wet Prep HPF POC 0-2    WBC Wet Prep HPF POC 2-4    KOH Prep POC Negative        Assessment & Plan:  1. Abdominal pain, unspecified abdominal location 2. Vaginal discomfort 3. Bacterial vaginosis - POCT CBC - POCT UA - Microscopic Only - POCT urinalysis dipstick - POCT Wet Prep with KOH - metroNIDAZOLE (FLAGYL) 500 MG tablet; Take 1 tablet (500 mg total) by mouth 2 (two) times daily with a meal. DO NOT CONSUME ALCOHOL WHILE TAKING THIS MEDICATION.  Dispense: 14 tablet; Refill: 0   Jagger Beahm PA-C  [facility] 02/06/2015 10:51 AM

## 2015-02-05 NOTE — Patient Instructions (Signed)
Bacterial Vaginosis Bacterial vaginosis is a vaginal infection that occurs when the normal balance of bacteria in the vagina is disrupted. It results from an overgrowth of certain bacteria. This is the most common vaginal infection in women of childbearing age. Treatment is important to prevent complications, especially in pregnant women, as it can cause a premature delivery. CAUSES  Bacterial vaginosis is caused by an increase in harmful bacteria that are normally present in smaller amounts in the vagina. Several different kinds of bacteria can cause bacterial vaginosis. However, the reason that the condition develops is not fully understood. RISK FACTORS Certain activities or behaviors can put you at an increased risk of developing bacterial vaginosis, including:  Having a new sex partner or multiple sex partners.  Douching.  Using an intrauterine device (IUD) for contraception. Women do not get bacterial vaginosis from toilet seats, bedding, swimming pools, or contact with objects around them. SIGNS AND SYMPTOMS  Some women with bacterial vaginosis have no signs or symptoms. Common symptoms include:  Grey vaginal discharge.  A fishlike odor with discharge, especially after sexual intercourse.  Itching or burning of the vagina and vulva.  Burning or pain with urination. DIAGNOSIS  Your health care provider will take a medical history and examine the vagina for signs of bacterial vaginosis. A sample of vaginal fluid may be taken. Your health care provider will look at this sample under a microscope to check for bacteria and abnormal cells. A vaginal pH test may also be done.  TREATMENT  Bacterial vaginosis may be treated with antibiotic medicines. These may be given in the form of a pill or a vaginal cream. A second round of antibiotics may be prescribed if the condition comes back after treatment.  HOME CARE INSTRUCTIONS   Only take over-the-counter or prescription medicines as  directed by your health care provider.  If antibiotic medicine was prescribed, take it as directed. Make sure you finish it even if you start to feel better.  Do not have sex until treatment is completed.  Tell all sexual partners that you have a vaginal infection. They should see their health care provider and be treated if they have problems, such as a mild rash or itching.  Practice safe sex by using condoms and only having one sex partner. SEEK MEDICAL CARE IF:   Your symptoms are not improving after 3 days of treatment.  You have increased discharge or pain.  You have a fever. MAKE SURE YOU:   Understand these instructions.  Will watch your condition.  Will get help right away if you are not doing well or get worse. FOR MORE INFORMATION  Centers for Disease Control and Prevention, Division of STD Prevention: www.cdc.gov/std American Sexual Health Association (ASHA): www.ashastd.org  Document Released: 05/14/2005 Document Revised: 03/04/2013 Document Reviewed: 12/24/2012 ExitCare Patient Information 2015 [employer]. This information is not intended to replace advice given to you by your health care provider. Make sure you discuss any questions you have with your health care provider.  

## 2015-02-18 ENCOUNTER — Ambulatory Visit (INDEPENDENT_AMBULATORY_CARE_PROVIDER_SITE_OTHER): Payer: Managed Care, Other (non HMO) | Admitting: Physician Assistant

## 2015-02-18 VITALS — BP 102/68 | HR 65 | Temp 97.5°F | Resp 16 | Ht 65.5 in | Wt 247.0 lb

## 2015-02-18 DIAGNOSIS — N898 Other specified noninflammatory disorders of vagina: Secondary | ICD-10-CM | POA: Diagnosis not present

## 2015-02-18 LAB — COMPREHENSIVE METABOLIC PANEL
ALT: 11 U/L (ref 6–29)
AST: 14 U/L (ref 10–30)
Albumin: 3.9 g/dL (ref 3.6–5.1)
Alkaline Phosphatase: 46 U/L (ref 33–115)
BUN: 21 mg/dL (ref 7–25)
CO2: 23 mmol/L (ref 20–31)
Calcium: 9 mg/dL (ref 8.6–10.2)
Chloride: 106 mmol/L (ref 98–110)
Creat: 0.65 mg/dL (ref 0.50–1.10)
Glucose, Bld: 73 mg/dL (ref 65–99)
Potassium: 4.3 mmol/L (ref 3.5–5.3)
Sodium: 136 mmol/L (ref 135–146)
Total Bilirubin: 0.4 mg/dL (ref 0.2–1.2)
Total Protein: 7 g/dL (ref 6.1–8.1)

## 2015-02-18 LAB — POCT WET + KOH PREP
Trich by wet prep: ABSENT
Yeast by KOH: ABSENT
Yeast by wet prep: ABSENT

## 2015-02-18 LAB — TSH: TSH: 0.733 u[IU]/mL (ref 0.350–4.500)

## 2015-02-18 LAB — POCT GLYCOSYLATED HEMOGLOBIN (HGB A1C): Hemoglobin A1C: 5

## 2015-02-18 NOTE — Patient Instructions (Signed)
You no longer have BV. You do not have diabetes. I will call you with rest of lab tests. We will discuss further options for weight loss at that time.

## 2015-02-18 NOTE — Progress Notes (Signed)
[facility] [address], [place] [address] [phone]  Date:  02/18/2015   Name:  Barbara Zimmerman   DOB:  May 07, 1991   MRN:  [id number]  PCP:  No PCP Per Patient    Chief Complaint: Follow-up and States she's been very bloated, wants help with this   History of Present Illness:  This is a 24 y.o. female who is presenting for follow up BV. She was here 02/05/15 and saw [employer], PA-C. At that time she was complaining of cramping lower abdominal pain, greater on left side, and vaginal pain. CBC and UA normal. Wet prep + for BV. She was treated with 7 days of flagyl. Abdominal pain and vaginal pain has resolved. States she still has clear mucousy discharge and wants to make sure the BV is gone. She denies vaginal itching. She is not currently sexually active but does use toys. Last tested for STDs 1 year ago.  Pt is also complaining of inability to lose weight. 4 years ago she was 190 pounds. She then got pregnant with twins. After giving birth she was 315 pounds. She has been working very hard for the past 1 year especially. She is now 34 but seems to be stuck there. She has only lost 3 pounds in the past 1 year. She works out every day for 1 hour - running/walking/squats/situps. She is diligent about her diet - two boiled eggs in the morning, lunch grilled chicken and salad or protein shake, dinner salad and chicken or vegetable lasagna. Stays away from starches, juice, soda. Drinks only water.  She does not have a hx DM or thyroid disease. Thyroid disease does run in her family. She has regular periods every month. No excess body hair. She is not breastfeeding. No excess stress and states her mood is generally good.  Review of Systems:  Review of Systems See HPI  There are no active problems to display for this patient.   Prior to Admission medications   Medication Sig Start Date End Date Taking? Authorizing Provider  EPINEPHrine (EPIPEN 2-PAK) 0.3  mg/0.3 mL IJ SOAJ injection Inject 0.3 mg into the muscle daily as needed. Allergic reaction   Yes Historical Provider, MD  ibuprofen (ADVIL,MOTRIN) 200 MG tablet Take 600 mg by mouth every 6 (six) hours as needed for moderate pain.   Yes Historical Provider, MD  Multiple Vitamins-Minerals (HAIR/SKIN/NAILS PO) Take 1 tablet by mouth daily.   Yes Historical Provider, MD  Multiple Vitamins-Minerals (MULTIVITAMIN WITH MINERALS) tablet Take 1 tablet by mouth daily.     Yes Historical Provider, MD    Allergies  Allergen Reactions  . Oysters [Shellfish Allergy] Swelling    clams    Past Surgical History  Procedure Laterality Date  . Abdominal surgery    . C secion    . Cesarean section      Social History  Substance Use Topics  . Smoking status: Current Every Day Smoker -- 0.50 packs/day for 5 years    Types: Cigarettes  . Smokeless tobacco: None  . Alcohol Use: No    Family History  Problem Relation Age of Onset  . Diabetes Mother     Medication list has been reviewed and updated.  Physical Examination:  Physical Exam  Constitutional: She is oriented to person, place, and time. She appears well-developed and well-nourished. No distress.  HENT:  Head: Normocephalic and atraumatic.  Right Ear: Hearing normal.  Left Ear: Hearing normal.  Nose: Nose  normal.  Eyes: Conjunctivae and lids are normal. Right eye exhibits no discharge. Left eye exhibits no discharge. No scleral icterus.  Neck: Carotid bruit is not present. No thyromegaly present.  Cardiovascular: Normal rate, regular rhythm, normal heart sounds and normal pulses.   No murmur heard. Pulmonary/Chest: Effort normal and breath sounds normal. No respiratory distress. She has no wheezes. She has no rhonchi. She has no rales.  Abdominal: Soft. Normal appearance. There is no tenderness.  Genitourinary: Vagina normal. There is no lesion on the right labia. There is no lesion on the left labia. No vaginal discharge found.   Musculoskeletal: Normal range of motion.  Lymphadenopathy:       Head (right side): No submental, no submandibular and no tonsillar adenopathy present.       Head (left side): No submental, no submandibular and no tonsillar adenopathy present.    She has no cervical adenopathy.  Neurological: She is alert and oriented to person, place, and time.  Skin: Skin is warm, dry and intact. No lesion and no rash noted.  Psychiatric: She has a normal mood and affect. Her speech is normal and behavior is normal. Thought content normal.   BP 102/68 mmHg  Pulse 65  Temp(Src) 97.5 F (36.4 C) (Oral)  Resp 16  Ht 5' 5.5" (1.664 m)  Wt 247 lb (112.038 kg)  BMI 40.46 kg/m2  SpO2 98%  Results for orders placed or performed in visit on 02/18/15  POCT Wet + KOH Prep ([facility])  Result Value Ref Range   Yeast by KOH Absent Present, Absent   Yeast by wet prep Absent Present, Absent   WBC by wet prep Few None, Few   Clue Cells Wet Prep HPF POC None None   Trich by wet prep Absent Present, Absent   Bacteria Wet Prep HPF POC Few None, Few   Epithelial Cells By [employer] Pref ([facility]) Moderate (A) None, Few   RBC,UR,HPF,POC None None RBC/hpf  POCT glycosylated hemoglobin (Hb A1C)  Result Value Ref Range   Hemoglobin A1C 5.0    Assessment and Plan:  1. Vaginal discharge Wet prep negative. Mucus discharge is likely normal vaginal discharge. - POCT Wet + KOH Prep ([facility]) - GC/Chlamydia Probe Amp  2. Severe obesity (BMI >= 40) A1C normal. TSH and CMP pending. Pt is doing all the right things with diet and exercise. If labs normal, we will discuss possibility of weight loss medications. - TSH - Comprehensive metabolic panel - POCT glycosylated hemoglobin (Hb A1C)   [employer] [employer]. Dyke Brackett, MHS [facility]  02/18/2015

## 2015-02-19 LAB — GC/CHLAMYDIA PROBE AMP
CT Probe RNA: NEGATIVE
GC Probe RNA: NEGATIVE

## 2015-02-26 ENCOUNTER — Telehealth: Payer: Self-pay

## 2015-02-26 NOTE — Telephone Encounter (Signed)
Pt calling about labs. Please review. Thanks  

## 2015-03-01 NOTE — Telephone Encounter (Signed)
Labs negative. Will refer to nutrition for further evaluation. Will hold off on weight loss medications until nutrition makes recommendations.

## 2015-03-01 NOTE — Telephone Encounter (Signed)
Pt.notified

## 2015-04-07 ENCOUNTER — Ambulatory Visit (INDEPENDENT_AMBULATORY_CARE_PROVIDER_SITE_OTHER): Payer: Managed Care, Other (non HMO) | Admitting: Family Medicine

## 2015-04-07 VITALS — BP 122/86 | HR 75 | Temp 97.7°F | Resp 16 | Ht 65.5 in | Wt 253.0 lb

## 2015-04-07 DIAGNOSIS — N760 Acute vaginitis: Secondary | ICD-10-CM | POA: Diagnosis not present

## 2015-04-07 LAB — POCT WET + KOH PREP
Trich by wet prep: ABSENT
Yeast by KOH: ABSENT
Yeast by wet prep: ABSENT

## 2015-04-07 MED ORDER — METRONIDAZOLE 500 MG PO TABS: 1000.0000 mg | ORAL_TABLET | Freq: Once | ORAL | Status: DC

## 2015-04-07 MED ORDER — PHENTERMINE HCL 37.5 MG PO CAPS: 37.5000 mg | ORAL_CAPSULE | ORAL | Status: DC

## 2015-04-07 NOTE — Patient Instructions (Signed)

## 2015-04-07 NOTE — Progress Notes (Addendum)
This chart was scribed for Elvina Sidle, MD by Stann Ore, medical scribe at [facility].The patient was seen in exam room 10 and the patient's care was started at 10:18 AM.  Patient ID: Barbara Zimmerman MRN: [id number], DOB: 03/15/1991, 24 y.o. Date of Encounter: 04/07/2015  Primary Physician: No PCP Per Patient  Chief Complaint:  Chief Complaint  Patient presents with  . Immunizations    tetnus shot  . Vaginitis    pt. states that she has been having more discharge, no color, clear mucus. pt. wants pap smear  . Nutrition Counseling  . std check    HPI:  Barbara Zimmerman is a 24 y.o. female who presents to [facility] complaining of vaginitis with more discharge of clear mucus. She wants to have a pap smear and std check done.   Patient is a gravida 30 para 2 24 year old woman with vaginitis it seems come every month. She's had this discharge for about 2 weeks and it precedes her last menstrual period which began on October 26. She has chronic low back pain since the birth of her twins and this is not changed.  She denies urinary symptoms or unusual vaginal bleeding. She is monogamous with her fianc. She last had STD testing in September about 6 weeks ago    Past Medical History  Diagnosis Date  . Asthma     childhood  . Post partum depression      Home Meds: Prior to Admission medications   Medication Sig Start Date End Date Taking? Authorizing Provider  EPINEPHrine (EPIPEN 2-PAK) 0.3 mg/0.3 mL IJ SOAJ injection Inject 0.3 mg into the muscle daily as needed. Allergic reaction   Yes Historical Provider, MD  ibuprofen (ADVIL,MOTRIN) 200 MG tablet Take 600 mg by mouth every 6 (six) hours as needed for moderate pain.   Yes Historical Provider, MD  Multiple Vitamins-Minerals (HAIR/SKIN/NAILS PO) Take 1 tablet by mouth daily.   Yes Historical Provider, MD  Multiple Vitamins-Minerals (MULTIVITAMIN WITH MINERALS) tablet Take 1 tablet by mouth  daily.     Yes Historical Provider, MD    Allergies:  Allergies  Allergen Reactions  . Oysters [Shellfish Allergy] Swelling    clams    Social History   Social History  . Marital Status: Single    Spouse Name: N/A  . Number of Children: N/A  . Years of Education: N/A   Occupational History  . Not on file.   Social History Main Topics  . Smoking status: Current Every Day Smoker -- 0.50 packs/day for 5 years    Types: Cigarettes  . Smokeless tobacco: Not on file  . Alcohol Use: No  . Drug Use: No  . Sexual Activity: Not Currently   Other Topics Concern  . Not on file   Social History Narrative     Review of Systems: Constitutional: negative for fever, chills, night sweats, weight changes, or fatigue  HEENT: negative for vision changes, hearing loss, congestion, rhinorrhea, ST, epistaxis, or sinus pressure Cardiovascular: negative for chest pain or palpitations Respiratory: negative for hemoptysis, wheezing, shortness of breath, or cough Abdominal: negative for abdominal pain, nausea, vomiting, diarrhea, or constipation Dermatological: negative for rash Neurologic: negative for headache, dizziness, or syncope GU: positive for vaginal discharge All other systems reviewed and are otherwise negative with the exception to those above and in the HPI.  Physical Exam: Blood pressure 122/86, pulse 75, temperature 97.7 F (36.5 C), temperature source Oral, resp.  rate 16, height 5' 5.5" (1.664 m), weight 253 lb (114.76 kg), last menstrual period 03/23/2015, SpO2 98 %., Body mass index is 41.45 kg/(m^2). General: Well developed, well nourished, in no acute distress. Head: Normocephalic, atraumatic, eyes without discharge, sclera non-icteric, nares are without discharge. Bilateral auditory canals clear, TM's are without perforation, pearly grey and translucent with reflective cone of light bilaterally. Oral cavity moist, posterior pharynx without exudate, erythema, peritonsillar  abscess, or post nasal drip.  Neck: Supple. No thyromegaly. Full ROM. No lymphadenopathy. Genitalia: Normal external female genitalia, moderate creamy white discharge with no cervical motion tenderness, normal cervix Abdomen: Soft, non-tender, non-distended with normoactive bowel sounds. No hepatomegaly. No rebound/guarding. No obvious abdominal masses. Msk:  Strength and tone normal for age. Extremities/Skin: Warm and dry. No clubbing or cyanosis. No edema. No rashes or suspicious lesions. Neuro: Alert and oriented X 3. Moves all extremities spontaneously. Gait is normal. CNII-XII grossly in tact. Psych:  Responds to questions appropriately with a normal affect.   Labs: Results for orders placed or performed in visit on 04/07/15  POCT Wet + KOH Prep  Result Value Ref Range   Yeast by KOH Absent Present, Absent   Yeast by wet prep Absent Present, Absent   WBC by wet prep None None, Few, Too numerous to count   Clue Cells Wet Prep HPF POC None None, Too numerous to count   Trich by wet prep Absent Present, Absent   Bacteria Wet Prep HPF POC Many (A) None, Few, Too numerous to count   Epithelial Cells By [employer] Pref ([facility]) Many (A) None, Few, Too numerous to count   RBC,UR,HPF,POC None None RBC/hpf     ASSESSMENT AND PLAN:  24 y.o. year old female with  This chart was scribed in my presence and reviewed by me personally.    ICD-9-CM ICD-10-CM   1. Vaginitis and vulvovaginitis 616.10 N76.0 POCT Wet + KOH Prep     Signed, Elvina SidleKurt Kennetta Pavlovic, MD    By signing my name below, I, Stann Oresung-Kai Tsai, attest that this documentation has been prepared under the direction and in the presence of Elvina SidleKurt Autumn Gunn, MD. Electronically Signed: Stann Oresung-Kai Tsai, Scribe. 04/07/2015 , 10:28 AM .  Signed, Elvina SidleKurt Ceciley Buist, MD 04/07/2015 10:28 AM

## 2015-04-18 ENCOUNTER — Telehealth: Payer: Self-pay

## 2015-04-18 NOTE — Telephone Encounter (Signed)
No other labs are available.  Please make sure patient's symptoms have resolved, or have her return to discuss

## 2015-04-18 NOTE — Telephone Encounter (Signed)
Pt states it's been over a week now and no one has given her her lab results. Please call [phone]

## 2015-04-18 NOTE — Telephone Encounter (Signed)
Im confused, were we supposed to send labs? Only wet prep was done. Please clarify.

## 2015-05-25 ENCOUNTER — Ambulatory Visit (INDEPENDENT_AMBULATORY_CARE_PROVIDER_SITE_OTHER): Payer: Managed Care, Other (non HMO) | Admitting: Family Medicine

## 2015-05-25 VITALS — BP 107/80 | HR 84 | Temp 98.0°F | Resp 16 | Ht 66.0 in | Wt 249.8 lb

## 2015-05-25 DIAGNOSIS — R350 Frequency of micturition: Secondary | ICD-10-CM | POA: Diagnosis not present

## 2015-05-25 DIAGNOSIS — N760 Acute vaginitis: Secondary | ICD-10-CM

## 2015-05-25 DIAGNOSIS — N898 Other specified noninflammatory disorders of vagina: Secondary | ICD-10-CM

## 2015-05-25 LAB — POCT URINALYSIS DIP (MANUAL ENTRY)
Bilirubin, UA: NEGATIVE
Blood, UA: NEGATIVE
Glucose, UA: NEGATIVE
Ketones, POC UA: NEGATIVE
Leukocytes, UA: NEGATIVE
Nitrite, UA: NEGATIVE
Protein Ur, POC: NEGATIVE
Spec Grav, UA: 1.015
Urobilinogen, UA: 0.2
pH, UA: 8.5

## 2015-05-25 LAB — POCT WET + KOH PREP
Trich by wet prep: ABSENT
Yeast by KOH: ABSENT
Yeast by wet prep: ABSENT

## 2015-05-25 LAB — POC MICROSCOPIC URINALYSIS (UMFC): Mucus: ABSENT

## 2015-05-25 LAB — POCT URINE PREGNANCY: Preg Test, Ur: NEGATIVE

## 2015-05-25 MED ORDER — FLUCONAZOLE 150 MG PO TABS: 150.0000 mg | ORAL_TABLET | Freq: Once | ORAL | Status: DC

## 2015-05-25 MED ORDER — BORIC ACID CRYS: CRYSTALS | Status: DC

## 2015-05-25 MED ORDER — METRONIDAZOLE 500 MG PO TABS: 500.0000 mg | ORAL_TABLET | Freq: Two times a day (BID) | ORAL | Status: DC

## 2015-05-25 NOTE — Progress Notes (Signed)
Chief Complaint:  Chief Complaint  Patient presents with  . Urinary Frequency    started last night  . Vaginal Discharge    Cloudy discharge    HPI: Barbara Zimmerman is a 24 y.o. female who [facility] complaining of increase urinary frequency and some cloudy dc since last night, she has odor. SHe deneis STDs, she has ahd BV in the past. She doe snot understand why she gets bacterial infections. She plays withs ex toys, she [place]. She denies any fevers, chils, n/v/abd pain or back pain. She is in a long distance relationship so plays with sex toys. She is UTD on her pap smear.  Past Medical History  Diagnosis Date  . Asthma     childhood  . Post partum depression    Past Surgical History  Procedure Laterality Date  . Abdominal surgery    . C secion    . Cesarean section     Social History   Social History  . Marital Status: Single    Spouse Name: N/A  . Number of Children: N/A  . Years of Education: N/A   Social History Main Topics  . Smoking status: Current Every Day Smoker -- 0.50 packs/day for 5 years    Types: Cigarettes  . Smokeless tobacco: None  . Alcohol Use: No  . Drug Use: No  . Sexual Activity: Not Currently   Other Topics Concern  . None   Social History Narrative   Family History  Problem Relation Age of Onset  . Diabetes Mother    Allergies  Allergen Reactions  . Oysters [Shellfish Allergy] Swelling    clams   Prior to Admission medications   Medication Sig Start Date End Date Taking? Authorizing Provider  EPINEPHrine (EPIPEN 2-PAK) 0.3 mg/0.3 mL IJ SOAJ injection Inject 0.3 mg into the muscle daily as needed. Allergic reaction   Yes Historical Provider, MD  ibuprofen (ADVIL,MOTRIN) 200 MG tablet Take 600 mg by mouth every 6 (six) hours as needed for moderate pain.   Yes Historical Provider, MD  Multiple Vitamins-Minerals (HAIR/SKIN/NAILS PO) Take 1 tablet by mouth daily.   Yes Historical Provider, MD   Multiple Vitamins-Minerals (MULTIVITAMIN WITH MINERALS) tablet Take 1 tablet by mouth daily.     Yes Historical Provider, MD  metroNIDAZOLE (FLAGYL) 500 MG tablet Take 2 tablets (1,000 mg total) by mouth once. DO NOT CONSUME ALCOHOL WHILE TAKING THIS MEDICATION. Patient not taking: Reported on 05/25/2015 04/07/15   Elvina SidleKurt Lauenstein, MD  phentermine 37.5 MG capsule Take 1 capsule (37.5 mg total) by mouth every other day. Patient not taking: Reported on 05/25/2015 04/07/15   Elvina SidleKurt Lauenstein, MD     ROS: The patient denies fevers, chills, night sweats, unintentional weight loss, chest pain, palpitations, wheezing, dyspnea on exertion, nausea, vomiting, abdominal pain,  hematuria, melena, numbness, weakness, or tingling.  All other systems have been reviewed and were otherwise negative with the exception of those mentioned in the HPI and as above.    PHYSICAL EXAM: Filed Vitals:   05/25/15 1911  BP: 107/80  Pulse: 84  Temp: 98 F (36.7 C)  Resp: 16   Body mass index is 40.34 kg/(m^2).   General: Alert, no acute distress, obese female  HEENT:  Normocephalic, atraumatic, oropharynx patent. EOMI, PERRLA Cardiovascular:  Regular rate and rhythm, no rubs murmurs or gallops.  No Carotid bruits, radial pulse intact. No pedal edema.  Respiratory: Clear to auscultation bilaterally.  No wheezes, rales, or rhonchi.  No cyanosis, no use of accessory musculature Abdominal: No organomegaly, abdomen is soft and non-tender, positive bowel sounds. No masses. Skin: No rashes. Neurologic: Facial musculature symmetric. Psychiatric: Patient acts appropriately throughout our interaction. Lymphatic: No cervical or submandibular lymphadenopathy Musculoskeletal: Gait intact. No edema, tenderness Neg CVA tenderness GU-+ mlaodorus white dc, copious amount. No pus, neg CMT No rashes or masses or lesions.    LABS: Results for orders placed or performed in visit on 05/25/15  POCT urinalysis dipstick  Result  Value Ref Range   Color, UA yellow yellow   Clarity, UA hazy (A) clear   Glucose, UA negative negative   Bilirubin, UA negative negative   Ketones, POC UA negative negative   Spec Grav, UA 1.015    Blood, UA negative negative   pH, UA 8.5    Protein Ur, POC negative negative   Urobilinogen, UA 0.2    Nitrite, UA Negative Negative   Leukocytes, UA Negative Negative  POCT Microscopic Urinalysis ([facility])  Result Value Ref Range   WBC,UR,HPF,POC None None WBC/hpf   RBC,UR,HPF,POC None None RBC/hpf   Bacteria Few (A) None, Too numerous to count   Mucus Absent Absent   Epithelial Cells, UR Per Microscopy Few (A) None, Too numerous to count cells/hpf  POCT Wet + KOH Prep  Result Value Ref Range   Yeast by KOH Absent Present, Absent   Yeast by wet prep Absent Present, Absent   WBC by wet prep Few None, Few, Too numerous to count   Clue Cells Wet Prep HPF POC None None, Too numerous to count   Trich by wet prep Absent Present, Absent   Bacteria Wet Prep HPF POC Few None, Few, Too numerous to count   Epithelial Cells By [employer] ([facility]) Many (A) None, Few, Too numerous to count   RBC,UR,HPF,POC None None RBC/hpf  POCT urine pregnancy  Result Value Ref Range   Preg Test, Ur Negative Negative     EKG/XRAY:   Primary read interpreted by Dr. Conley Rolls at [facility].   ASSESSMENT/PLAN: Encounter Diagnoses  Name Primary?  . Urinary frequency Yes  . Vaginal discharge    I suspect she has some BV or yeast, the dc and odor is thick and pungent Rx Flagyl  Rx Diflucan  Gave her precautions to prevent BV and yeast, gave her 1 rx for boric acid since she feels she gets it frequently aroudnher cycle and also when she plays with sex toys, advise that if she feels sxs then can use boric acid capsules x 3 days prn  Fu with labs: urine cx and GC pending  Gross sideeffects, risk and benefits, and alternatives of medications d/w patient. Patient is aware that all medications have potential sideeffects and we  are unable to predict every sideeffect or drug-drug interaction that may occur.  Thao Le DO  05/25/2015 9:32 PM

## 2015-05-25 NOTE — Patient Instructions (Signed)

## 2015-05-27 LAB — URINE CULTURE: Colony Count: 30000

## 2015-05-27 LAB — GC/CHLAMYDIA PROBE AMP
CT Probe RNA: NOT DETECTED
GC Probe RNA: NOT DETECTED

## 2015-06-09 ENCOUNTER — Telehealth: Payer: Self-pay

## 2015-06-09 NOTE — Telephone Encounter (Signed)
Patient states that the Flagyl is making her sick on her stomach. Can she be given something different?   Please call patient at [phone].

## 2015-06-09 NOTE — Telephone Encounter (Signed)
Spoke with patient.

## 2015-06-09 NOTE — Telephone Encounter (Signed)
Pt called requesting lab results. Informed pt GC probe was negative. Please comment on urine culture results.  Thank you.

## 2016-09-19 ENCOUNTER — Encounter (HOSPITAL_COMMUNITY): Payer: Self-pay | Admitting: Emergency Medicine

## 2016-09-19 ENCOUNTER — Encounter (HOSPITAL_COMMUNITY): Payer: Self-pay

## 2016-09-19 ENCOUNTER — Emergency Department (HOSPITAL_COMMUNITY)
Admission: EM | Admit: 2016-09-19 | Discharge: 2016-09-19 | Disposition: A | Discharge status: Home / Self Care | Payer: 59 | Source: Home / Self Care | Attending: Emergency Medicine | Admitting: Emergency Medicine

## 2016-09-19 DIAGNOSIS — J029 Acute pharyngitis, unspecified: Secondary | ICD-10-CM | POA: Diagnosis present

## 2016-09-19 DIAGNOSIS — J36 Peritonsillar abscess: Secondary | ICD-10-CM | POA: Insufficient documentation

## 2016-09-19 DIAGNOSIS — F17210 Nicotine dependence, cigarettes, uncomplicated: Secondary | ICD-10-CM | POA: Insufficient documentation

## 2016-09-19 DIAGNOSIS — J039 Acute tonsillitis, unspecified: Secondary | ICD-10-CM | POA: Insufficient documentation

## 2016-09-19 DIAGNOSIS — Z79899 Other long term (current) drug therapy: Secondary | ICD-10-CM

## 2016-09-19 DIAGNOSIS — J45909 Unspecified asthma, uncomplicated: Secondary | ICD-10-CM

## 2016-09-19 LAB — CBC WITH DIFFERENTIAL/PLATELET
Basophils Absolute: 0 10*3/uL (ref 0.0–0.1)
Basophils Absolute: 0 10*3/uL (ref 0.0–0.1)
Basophils Relative: 0 %
Basophils Relative: 0 %
Eosinophils Absolute: 0.3 10*3/uL (ref 0.0–0.7)
Eosinophils Absolute: 0.2 10*3/uL (ref 0.0–0.7)
Eosinophils Relative: 1 %
Eosinophils Relative: 1 %
HCT: 39.3 % (ref 36.0–46.0)
HCT: 38.9 % (ref 36.0–46.0)
Hemoglobin: 13 g/dL (ref 12.0–15.0)
Hemoglobin: 12.7 g/dL (ref 12.0–15.0)
Lymphocytes Relative: 5 %
Lymphocytes Relative: 9 %
Lymphs Abs: 1.1 10*3/uL (ref 0.7–4.0)
Lymphs Abs: 2.2 10*3/uL (ref 0.7–4.0)
MCH: 29.4 pg (ref 26.0–34.0)
MCH: 29.4 pg (ref 26.0–34.0)
MCHC: 33.1 g/dL (ref 30.0–36.0)
MCHC: 32.6 g/dL (ref 30.0–36.0)
MCV: 88.9 fL (ref 78.0–100.0)
MCV: 90 fL (ref 78.0–100.0)
Monocytes Absolute: 1.8 10*3/uL — ABNORMAL HIGH (ref 0.1–1.0)
Monocytes Absolute: 2 10*3/uL — ABNORMAL HIGH (ref 0.1–1.0)
Monocytes Relative: 8 %
Monocytes Relative: 8 %
Neutro Abs: 18.2 10*3/uL — ABNORMAL HIGH (ref 1.7–7.7)
Neutro Abs: 19.8 10*3/uL — ABNORMAL HIGH (ref 1.7–7.7)
Neutrophils Relative %: 86 %
Neutrophils Relative %: 82 %
Platelets: 243 10*3/uL (ref 150–400)
Platelets: 223 10*3/uL (ref 150–400)
RBC: 4.42 MIL/uL (ref 3.87–5.11)
RBC: 4.32 MIL/uL (ref 3.87–5.11)
RDW: 13.6 % (ref 11.5–15.5)
RDW: 13.9 % (ref 11.5–15.5)
WBC: 21.3 10*3/uL — ABNORMAL HIGH (ref 4.0–10.5)
WBC: 24.2 10*3/uL — ABNORMAL HIGH (ref 4.0–10.5)

## 2016-09-19 LAB — BASIC METABOLIC PANEL
Anion gap: 8 (ref 5–15)
BUN: 8 mg/dL (ref 6–20)
CO2: 25 mmol/L (ref 22–32)
Calcium: 8.7 mg/dL — ABNORMAL LOW (ref 8.9–10.3)
Chloride: 103 mmol/L (ref 101–111)
Creatinine, Ser: 0.75 mg/dL (ref 0.44–1.00)
GFR calc Af Amer: >60 mL/min (ref >60)
GFR calc non Af Amer: >60 mL/min (ref >60)
Glucose, Bld: 91 mg/dL (ref 65–99)
Potassium: 3.8 mmol/L (ref 3.5–5.1)
Sodium: 136 mmol/L (ref 135–145)

## 2016-09-19 LAB — COMPREHENSIVE METABOLIC PANEL
ALT: 12 U/L — ABNORMAL LOW (ref 14–54)
AST: 17 U/L (ref 15–41)
Albumin: 3.7 g/dL (ref 3.5–5.0)
Alkaline Phosphatase: 42 U/L (ref 38–126)
Anion gap: 10 (ref 5–15)
BUN: 12 mg/dL (ref 6–20)
CO2: 23 mmol/L (ref 22–32)
Calcium: 9.3 mg/dL (ref 8.9–10.3)
Chloride: 102 mmol/L (ref 101–111)
Creatinine, Ser: 0.76 mg/dL (ref 0.44–1.00)
GFR calc Af Amer: >60 mL/min (ref >60)
GFR calc non Af Amer: >60 mL/min (ref >60)
Glucose, Bld: 113 mg/dL — ABNORMAL HIGH (ref 65–99)
Potassium: 3.7 mmol/L (ref 3.5–5.1)
Sodium: 135 mmol/L (ref 135–145)
Total Bilirubin: 0.5 mg/dL (ref 0.3–1.2)
Total Protein: 6.7 g/dL (ref 6.5–8.1)

## 2016-09-19 LAB — I-STAT BETA HCG BLOOD, ED (MC, WL, AP ONLY): I-stat hCG, quantitative: 9 m[IU]/mL — ABNORMAL HIGH (ref <5)

## 2016-09-19 LAB — RAPID STREP SCREEN (MED CTR MEBANE ONLY)
Streptococcus, Group A Screen (Direct): NEGATIVE
Streptococcus, Group A Screen (Direct): NEGATIVE

## 2016-09-19 LAB — I-STAT CG4 LACTIC ACID, ED: Lactic Acid, Venous: 0.78 mmol/L (ref 0.5–1.9)

## 2016-09-19 MED ORDER — AMOXICILLIN-POT CLAVULANATE 875-125 MG PO TABS: 1.0000 | ORAL_TABLET | Freq: Two times a day (BID) | ORAL | 0 refills | Status: DC

## 2016-09-19 MED ORDER — OXYCODONE HCL 5 MG PO TABS
5.0000 mg | ORAL_TABLET | Freq: Once | ORAL | Status: AC
  Administered 2016-09-19: 5 mg via ORAL
  Filled 2016-09-19: qty 1

## 2016-09-19 MED ORDER — AMOXICILLIN-POT CLAVULANATE 875-125 MG PO TABS
1.0000 | ORAL_TABLET | Freq: Once | ORAL | Status: AC
  Administered 2016-09-19: 1 via ORAL
  Filled 2016-09-19: qty 1

## 2016-09-19 MED ORDER — ACETAMINOPHEN 325 MG PO TABS: 650.0000 mg | ORAL_TABLET | Freq: Once | ORAL | Status: DC | PRN

## 2016-09-19 MED ORDER — LIDOCAINE VISCOUS 2 % MT SOLN
15.0000 mL | Freq: Once | OROMUCOSAL | Status: AC
  Administered 2016-09-19: 15 mL via OROMUCOSAL
  Filled 2016-09-19: qty 15

## 2016-09-19 MED ORDER — IBUPROFEN 200 MG PO TABS
600.0000 mg | ORAL_TABLET | Freq: Once | ORAL | Status: AC
  Administered 2016-09-19: 600 mg via ORAL
  Filled 2016-09-19: qty 1

## 2016-09-19 MED ORDER — ACETAMINOPHEN 325 MG PO TABS
ORAL_TABLET | ORAL | Status: AC
  Administered 2016-09-19: 650 mg
  Filled 2016-09-19: qty 2

## 2016-09-19 NOTE — ED Provider Notes (Signed)
[facility]EM[facility]Y DEPT Provider Note   CSN: [id number] Arrival date & time: 09/19/16  0101  By signing my name below, I, Linna Darner, attest that this documentation has been prepared under the direction and in the presence of physician practitioner, Tomasita Crumble, MD. Electronically Signed: Linna Darner, Scribe. 09/19/2016. 2:15 AM.  History   Chief Complaint Chief Complaint  Patient presents with  . Sore Throat  . Abscess    The history is provided by the patient. No language interpreter was used.     HPI Comments: Barbara Zimmerman is a 26 y.o. female who presents to the Emergency Department complaining of a constant sore throat beginning yesterday morning. She states her throat soreness s most significant in the left side of her oropharynx. Pt reports associated fevers, generalized body aches, and some difficulty swallowing secondary to the severity of her sore throat. She also reports a moderate area of swelling in her oropharynx that has been bleeding intermittently. No alleviating factors noted. No known contacts with similar symptoms. She denies nausea, vomiting, or any other associated symptoms.  Past Medical History:  Diagnosis Date  . Asthma    childhood  . Post partum depression     There are no active problems to display for this patient.   Past Surgical History:  Procedure Laterality Date  . ABDOMINAL SURGERY    . c secion    . CESAREAN SECTION      OB History    Gravida Para Term Preterm AB Living   1             SAB TAB Ectopic Multiple Live Births                   Home Medications    Prior to Admission medications   Medication Sig Start Date End Date Taking? Authorizing Provider  Boric Acid CRYS Insert intravaginally daily x 3 days prn 05/25/15   Thao P Le, DO  EPINEPHrine (EPIPEN 2-PAK) 0.3 mg/0.3 mL IJ SOAJ injection Inject 0.3 mg into the muscle daily as needed. Allergic reaction    Historical Provider, MD  fluconazole (DIFLUCAN) 150 MG tablet  Take 1 tablet (150 mg total) by mouth once. May repeat after flagyl prn 05/25/15   Thao P Le, DO  ibuprofen (ADVIL,MOTRIN) 200 MG tablet Take 600 mg by mouth every 6 (six) hours as needed for moderate pain.    Historical Provider, MD  metroNIDAZOLE (FLAGYL) 500 MG tablet Take 1 tablet (500 mg total) by mouth 2 (two) times daily. DO NOT CONSUME ALCOHOL WHILE TAKING THIS MEDICATION. 05/25/15   Thao P Le, DO  Multiple Vitamins-Minerals (HAIR/SKIN/NAILS PO) Take 1 tablet by mouth daily.    Historical Provider, MD  Multiple Vitamins-Minerals (MULTIVITAMIN WITH MINERALS) tablet Take 1 tablet by mouth daily.      Historical Provider, MD  phentermine 37.5 MG capsule Take 1 capsule (37.5 mg total) by mouth every other day. Patient not taking: Reported on 05/25/2015 04/07/15   Elvina Sidle, MD    Family History Family History  Problem Relation Age of Onset  . Diabetes Mother     Social History Social History  Substance Use Topics  . Smoking status: Current Every Day Smoker    Packs/day: 0.50    Years: 5.00    Types: Cigarettes  . Smokeless tobacco: Never Used  . Alcohol use No     Allergies   Oysters [shellfish allergy]   Review of Systems Review of Systems  All other systems  reviewed and are negative for acute change except as noted in the HPI.  Physical Exam Updated Vital Signs BP 130/62 (BP Location: Right Arm)   Pulse (!) 112   Temp (!) 100.9 F (38.3 C) (Oral)   Resp 18   Ht  (1.676 m)   Wt 220 lb (99.8 kg)   LMP 08/26/2016 (Exact Date)   SpO2 99%   BMI 35.51 kg/m   Physical Exam  Constitutional: She is oriented to person, place, and time. She appears well-developed and well-nourished. No distress.  HENT:  Head: Normocephalic and atraumatic.  Nose: Nose normal.  Mouth/Throat: No posterior oropharyngeal erythema. Tonsils are 2+ on the left. Tonsillar exudate.  Left tonsil: Swelling and exudates present.  Eyes: Conjunctivae and EOM are normal. Pupils are  equal, round, and reactive to light. No scleral icterus.  Neck: Normal range of motion. Neck supple. No JVD present. No tracheal deviation present. No thyromegaly present.  Cardiovascular: Normal rate, regular rhythm and normal heart sounds.  Exam reveals no gallop and no friction rub.   No murmur heard. Pulmonary/Chest: Effort normal and breath sounds normal. No respiratory distress. She has no wheezes. She exhibits no tenderness.  Abdominal: Soft. Bowel sounds are normal. She exhibits no distension and no mass. There is no tenderness. There is no rebound and no guarding.  Musculoskeletal: Normal range of motion. She exhibits no edema or tenderness.  Lymphadenopathy:    She has no cervical adenopathy.  Neurological: She is alert and oriented to person, place, and time. No cranial nerve deficit. She exhibits normal muscle tone.  Skin: Skin is warm and dry. No rash noted. No erythema. No pallor.  Nursing note and vitals reviewed.  ED Treatments / Results  Labs (all labs ordered are listed, but only abnormal results are displayed) Labs Reviewed  CBC WITH DIFFERENTIAL/PLATELET - Abnormal; Notable for the following:       Result Value   WBC 21.3 (*)    Neutro Abs 18.2 (*)    Monocytes Absolute 1.8 (*)    All other components within normal limits  COMPREHENSIVE METABOLIC PANEL - Abnormal; Notable for the following:    Glucose, Bld 113 (*)    ALT 12 (*)    All other components within normal limits  I-STAT BETA HCG BLOOD, ED ([facility], [facility], [facility] ONLY) - Abnormal; Notable for the following:    I-stat hCG, quantitative 9.0 (*)    All other components within normal limits  RAPID STREP SCREEN (NOT AT [facility])  CULTURE, GROUP A STREP ([facility])  I-STAT CG4 LACTIC ACID, ED    EKG  EKG Interpretation None       Radiology No results found.  Procedures Procedures (including critical care time)  DIAGNOSTIC STUDIES: Oxygen Saturation is 99% on RA, normal by my interpretation.    COORDINATION OF  CARE: 2:19 AM Discussed treatment plan with pt at bedside and pt agreed to plan.  Medications Ordered in ED Medications  acetaminophen (TYLENOL) tablet 650 mg (not administered)  acetaminophen (TYLENOL) 325 MG tablet (650 mg  Given 09/19/16 0116)     Initial Impression / Assessment and Plan / ED Course  I have reviewed the triage vital signs and the nursing notes.  Pertinent labs & imaging results that were available during my care of the patient were reviewed by me and considered in my medical decision making (see chart for details).     Patient presents emergency department for sore throat. Worse on the left side. Physical exam  does reveal tonsillitis. No abscess formation seen.  She was given Augmentin for treatment, also Tylenol and ibuprofen for her fever. We'll discharge with prescription and primary care follow-up. Vital signs now normalized.  She appears well in acute distress, vital signs were within her normal limits and she is safe for discharge.  Final Clinical Impressions(s) / ED Diagnoses   Final diagnoses:  None    New Prescriptions New Prescriptions   No medications on file   I personally performed the services described in this documentation, which was scribed in my presence. The recorded information has been reviewed and is accurate.      Tomasita Crumble, MD 09/19/16 0400

## 2016-09-19 NOTE — ED Triage Notes (Signed)
Pt endorses sore throat since last night with bleeding. Pt appears to have an abscess in throat. Airway intact, VSS.

## 2016-09-19 NOTE — ED Triage Notes (Signed)
Patient reports worsening sore throat with swelling for several days , seen here and treated /discharged home with no improvement , denies fever or chills , airway intact /respirations unlabored .

## 2016-09-20 ENCOUNTER — Emergency Department (HOSPITAL_COMMUNITY): Payer: 59

## 2016-09-20 ENCOUNTER — Emergency Department (HOSPITAL_COMMUNITY)
Admission: EM | Admit: 2016-09-20 | Discharge: 2016-09-20 | Disposition: A | Discharge status: Home / Self Care | Payer: 59 | Attending: Emergency Medicine | Admitting: Emergency Medicine

## 2016-09-20 DIAGNOSIS — J36 Peritonsillar abscess: Secondary | ICD-10-CM

## 2016-09-20 DIAGNOSIS — J039 Acute tonsillitis, unspecified: Secondary | ICD-10-CM

## 2016-09-20 HISTORY — DX: Acute tonsillitis, unspecified: J03.90

## 2016-09-20 LAB — CULTURE, GROUP A STREP (THRC)

## 2016-09-20 LAB — MONONUCLEOSIS SCREEN: Mono Screen: NEGATIVE

## 2016-09-20 MED ORDER — OXYCODONE-ACETAMINOPHEN 5-325 MG PO TABS: 2.0000 | ORAL_TABLET | Freq: Four times a day (QID) | ORAL | 0 refills | Status: DC | PRN

## 2016-09-20 MED ORDER — IOPAMIDOL (ISOVUE-300) INJECTION 61%
INTRAVENOUS | Status: AC
  Administered 2016-09-20: 75 mL
  Filled 2016-09-20: qty 75

## 2016-09-20 MED ORDER — CLINDAMYCIN HCL 300 MG PO CAPS: 300.0000 mg | ORAL_CAPSULE | Freq: Four times a day (QID) | ORAL | 0 refills | Status: DC

## 2016-09-20 MED ORDER — PREDNISONE 10 MG PO TABS: 20.0000 mg | ORAL_TABLET | Freq: Two times a day (BID) | ORAL | 0 refills | Status: DC

## 2016-09-20 MED ORDER — SODIUM CHLORIDE 0.9 % IV BOLUS (SEPSIS)
1000.0000 mL | Freq: Once | INTRAVENOUS | Status: AC
  Administered 2016-09-20: 1000 mL via INTRAVENOUS

## 2016-09-20 MED ORDER — CLINDAMYCIN PHOSPHATE 600 MG/50ML IV SOLN
600.0000 mg | Freq: Once | INTRAVENOUS | Status: AC
  Administered 2016-09-20: 600 mg via INTRAVENOUS
  Filled 2016-09-20: qty 50

## 2016-09-20 MED ORDER — MORPHINE SULFATE (PF) 4 MG/ML IV SOLN
4.0000 mg | Freq: Once | INTRAVENOUS | Status: AC
  Administered 2016-09-20: 4 mg via INTRAVENOUS
  Filled 2016-09-20: qty 1

## 2016-09-20 MED ORDER — DEXAMETHASONE SODIUM PHOSPHATE 10 MG/ML IJ SOLN
10.0000 mg | Freq: Once | INTRAMUSCULAR | Status: AC
  Administered 2016-09-20: 10 mg via INTRAVENOUS
  Filled 2016-09-20: qty 1

## 2016-09-20 NOTE — ED Provider Notes (Signed)
[facility] Provider Note   CSN: [id number] Arrival date & time: 09/19/16  2240  By signing my name below, I, Barbara Zimmerman, attest that this documentation has been prepared under the direction and in the presence of Geoffery Lyons, MD. Electronically Signed: Elder Zimmerman, Scribe. 09/20/16. 2:24 AM.   History   Chief Complaint Chief Complaint  Patient presents with  . Sore Throat    Tonsilitis    HPI Barbara Zimmerman is a 26 y.o. female with history of childhood asthma who presents to the ED for evaluation of throat pain. This patient states that in the last 48 hours she has experienced throat pain, subjective fever, and myalgias. She was seen at this facility yesterday where she had negative Strepp test. Given Augmentin and discharged. She re-presents today stating that her symptoms have worsened. She is reporting ear pain as well. Denies any fever today.   The history is provided by the patient. No language interpreter was used.  Sore Throat  This is a new problem. The current episode started 2 days ago. The problem occurs constantly. The problem has not changed since onset.   Past Medical History:  Diagnosis Date  . Asthma    childhood  . Post partum depression   . Tonsillitis     There are no active problems to display for this patient.   Past Surgical History:  Procedure Laterality Date  . ABDOMINAL SURGERY    . c secion    . CESAREAN SECTION      OB History    Gravida Para Term Preterm AB Living   1             SAB TAB Ectopic Multiple Live Births                   Home Medications    Prior to Admission medications   Medication Sig Start Date End Date Taking? Authorizing Provider  amoxicillin-clavulanate (AUGMENTIN) 875-125 MG tablet Take 1 tablet by mouth 2 (two) times daily. One po bid x 7 days 09/19/16   Tomasita Crumble, MD    Family History Family History  Problem Relation Age of Onset  . Diabetes Mother     Social History Social  History  Substance Use Topics  . Smoking status: Current Every Day Smoker    Packs/day: 0.50    Years: 5.00    Types: Cigarettes  . Smokeless tobacco: Never Used  . Alcohol use No     Allergies   Oysters [shellfish allergy]   Review of Systems Review of Systems All systems reviewed and are negative for acute change except as noted in the HPI.  Physical Exam Updated Vital Signs BP 130/64 (BP Location: Right Arm)   Pulse 94   Temp 99.9 F (37.7 C) (Oral)   Resp 18   LMP 08/26/2016 (Exact Date)   SpO2 100%   Physical Exam  Constitutional: She appears well-developed and well-nourished. No distress.  HENT:  Head: Normocephalic and atraumatic.  There is erythema of the posterior oropharyx with exudates present. Swelling is more pronounced on the left. No stridor.   Eyes: Conjunctivae are normal.  Neck: Normal range of motion. Neck supple.  Cardiovascular: Normal rate and regular rhythm.   No murmur heard. Pulmonary/Chest: Effort normal and breath sounds normal. No respiratory distress.  Abdominal: Soft. There is no tenderness.  Musculoskeletal: She exhibits no edema.  Lymphadenopathy:    She has cervical adenopathy.  Neurological: She is alert.  Skin: Skin  is warm and dry.  Psychiatric: She has a normal mood and affect.  Nursing note and vitals reviewed.    ED Treatments / Results  Labs (all labs ordered are listed, but only abnormal results are displayed) Labs Reviewed  CBC WITH DIFFERENTIAL/PLATELET - Abnormal; Notable for the following:       Result Value   WBC 24.2 (*)    Neutro Abs 19.8 (*)    Monocytes Absolute 2.0 (*)    All other components within normal limits  BASIC METABOLIC PANEL - Abnormal; Notable for the following:    Calcium 8.7 (*)    All other components within normal limits  RAPID STREP SCREEN (NOT AT [facility])  CULTURE, GROUP A STREP [facility])    EKG  EKG Interpretation None       Radiology No results found.  Procedures Procedures  (including critical care time)  Medications Ordered in ED Medications - No data to display   Initial Impression / Assessment and Plan / ED Course  I have reviewed the triage vital signs and the nursing notes.  Pertinent labs & imaging results that were available during my care of the patient were reviewed by me and considered in my medical decision making (see chart for details).  Patient presents with severe sore throat. She has a white count of 24,000 and what appears to be a developing phlegmon/early abscess in the left peritonsillar soft tissues. There is no airway involvement and she is tolerating by mouth liquids. She was given IV Decadron, IV clindamycin, and pain medicine. She was also hydrated with normal saline.  I've discussed these findings with Dr. Jenne Pane from ENT. She will be discharged with steroids, clindamycin, pain medicine, and is to follow-up with him on Monday.  Final Clinical Impressions(s) / ED Diagnoses   Final diagnoses:  None    New Prescriptions New Prescriptions   No medications on file  I personally performed the services described in this documentation, which was scribed in my presence. The recorded information has been reviewed and is accurate.       Geoffery Lyons, MD 09/20/16 [phone]

## 2016-09-20 NOTE — Discharge Instructions (Signed)
Clindamycin and prednisone as prescribed.  Percocet as prescribed as needed for pain.  Follow-up with Dr. Jenne Pane on Monday in the ENT clinic. His contact information has been provided in this discharge summary for you to call and make these arrangements. Return to the emergency department in the meantime if you develop difficulty breathing, or an inability to swallow.

## 2016-09-22 LAB — CULTURE, GROUP A STREP (THRC)

## 2017-12-11 ENCOUNTER — Encounter (HOSPITAL_COMMUNITY): Payer: Self-pay

## 2017-12-11 ENCOUNTER — Other Ambulatory Visit: Payer: Self-pay

## 2017-12-11 ENCOUNTER — Emergency Department (HOSPITAL_COMMUNITY): Payer: Medicaid Other

## 2017-12-11 ENCOUNTER — Emergency Department (HOSPITAL_COMMUNITY)
Admission: EM | Admit: 2017-12-11 | Discharge: 2017-12-11 | Disposition: A | Discharge status: Home / Self Care | Payer: Medicaid Other | Attending: Emergency Medicine | Admitting: Emergency Medicine

## 2017-12-11 DIAGNOSIS — J45909 Unspecified asthma, uncomplicated: Secondary | ICD-10-CM | POA: Insufficient documentation

## 2017-12-11 DIAGNOSIS — F17210 Nicotine dependence, cigarettes, uncomplicated: Secondary | ICD-10-CM | POA: Diagnosis not present

## 2017-12-11 DIAGNOSIS — O200 Threatened abortion: Secondary | ICD-10-CM | POA: Diagnosis present

## 2017-12-11 DIAGNOSIS — R8271 Bacteriuria: Secondary | ICD-10-CM | POA: Diagnosis not present

## 2017-12-11 DIAGNOSIS — Z3491 Encounter for supervision of normal pregnancy, unspecified, first trimester: Secondary | ICD-10-CM | POA: Insufficient documentation

## 2017-12-11 DIAGNOSIS — Z3A01 Less than 8 weeks gestation of pregnancy: Secondary | ICD-10-CM | POA: Diagnosis not present

## 2017-12-11 LAB — URINALYSIS, ROUTINE W REFLEX MICROSCOPIC
Bilirubin Urine: NEGATIVE
Glucose, UA: NEGATIVE mg/dL
Ketones, ur: 20 mg/dL — AB
Leukocytes, UA: NEGATIVE
Nitrite: NEGATIVE
Protein, ur: NEGATIVE mg/dL
Specific Gravity, Urine: 1.029 (ref 1.005–1.030)
pH: 5 (ref 5.0–8.0)

## 2017-12-11 LAB — ABO/RH: ABO/RH(D): B POS

## 2017-12-11 LAB — CBC WITH DIFFERENTIAL/PLATELET
Abs Immature Granulocytes: 0 10*3/uL (ref 0.0–0.1)
Basophils Absolute: 0 10*3/uL (ref 0.0–0.1)
Basophils Relative: 1 %
Eosinophils Absolute: 0.4 10*3/uL (ref 0.0–0.7)
Eosinophils Relative: 9 %
HCT: 40.8 % (ref 36.0–46.0)
Hemoglobin: 13.2 g/dL (ref 12.0–15.0)
Immature Granulocytes: 0 %
Lymphocytes Relative: 42 %
Lymphs Abs: 1.8 10*3/uL (ref 0.7–4.0)
MCH: 29 pg (ref 26.0–34.0)
MCHC: 32.4 g/dL (ref 30.0–36.0)
MCV: 89.7 fL (ref 78.0–100.0)
Monocytes Absolute: 0.3 10*3/uL (ref 0.1–1.0)
Monocytes Relative: 8 %
Neutro Abs: 1.7 10*3/uL (ref 1.7–7.7)
Neutrophils Relative %: 40 %
Platelets: 285 10*3/uL (ref 150–400)
RBC: 4.55 MIL/uL (ref 3.87–5.11)
RDW: 13.1 % (ref 11.5–15.5)
WBC: 4.3 10*3/uL (ref 4.0–10.5)

## 2017-12-11 LAB — BASIC METABOLIC PANEL
Anion gap: 8 (ref 5–15)
BUN: 11 mg/dL (ref 6–20)
CO2: 26 mmol/L (ref 22–32)
Calcium: 9.2 mg/dL (ref 8.9–10.3)
Chloride: 105 mmol/L (ref 98–111)
Creatinine, Ser: 0.7 mg/dL (ref 0.44–1.00)
GFR calc Af Amer: >60 mL/min (ref >60)
GFR calc non Af Amer: >60 mL/min (ref >60)
Glucose, Bld: 86 mg/dL (ref 70–99)
Potassium: 3.9 mmol/L (ref 3.5–5.1)
Sodium: 139 mmol/L (ref 135–145)

## 2017-12-11 LAB — WET PREP, GENITAL
Clue Cells Wet Prep HPF POC: NONE SEEN
Sperm: NONE SEEN
Trich, Wet Prep: NONE SEEN
Yeast Wet Prep HPF POC: NONE SEEN

## 2017-12-11 LAB — HCG, QUANTITATIVE, PREGNANCY: hCG, Beta Chain, Quant, S: 90443 m[IU]/mL — ABNORMAL HIGH (ref <5)

## 2017-12-11 LAB — I-STAT BETA HCG BLOOD, ED (MC, WL, AP ONLY): I-stat hCG, quantitative: >2000 m[IU]/mL — ABNORMAL HIGH (ref <5)

## 2017-12-11 MED ORDER — PRENATAL VITAMIN 27-0.8 MG PO TABS
1.0000 | ORAL_TABLET | Freq: Every day | ORAL | 0 refills | Status: DC
Start: 2017-12-11 — End: 2020-07-19

## 2017-12-11 MED ORDER — CEPHALEXIN 500 MG PO CAPS: 500.0000 mg | ORAL_CAPSULE | Freq: Two times a day (BID) | ORAL | 0 refills | Status: DC

## 2017-12-11 NOTE — Discharge Instructions (Signed)
Please see the information and instructions below regarding your visit.  Your diagnoses today include:  1. Threatened miscarriage   2. Bacteria in urine   3. First trimester pregnancy    You are diagnosed with a first trimester pregnancy in the uterus.  It is unclear if this may develop into a miscarriage given her bleeding.  Please monitor the bleeding closely, and return for any significant increase in the bleeding.  Otherwise, he will follow-up with OB/GYN.  Tests performed today include: See side panel of your discharge paperwork for testing performed today. Vital signs are listed at the bottom of these instructions.   Your urine showed that she had a small amount of bacteria, but no evidence of urinary tract infection.  We treat this no matter what in women who are pregnant.  Medications prescribed:    Take any prescribed medications only as prescribed, and any over the counter medications only as directed on the packaging.  Please take all of your antibiotics until finished.   You may develop abdominal discomfort or nausea from the antibiotic. If this occurs, you may take it with food. Some patients also get diarrhea with antibiotics. You may help offset this with probiotics which you can buy or get in yogurt. Do not eat or take the probiotics until 2 hours after your antibiotic. Some women develop vaginal yeast infections after antibiotics. If you develop unusual vaginal discharge after being on this medication, please see your primary care provider.   Some people develop allergies to antibiotics. Symptoms of antibiotic allergy can be mild and include a flat rash and itching. They can also be more serious and include:  ?Hives - Hives are raised, red patches of skin that are usually very itchy.  ?Lip or tongue swelling  ?Trouble swallowing or breathing  ?Blistering of the skin or mouth.  If you have any of these serious symptoms, please seek emergency medical care  immediately.  Please start taking a prenatal vitamin daily.  The only safe pain medication pregnancy is Tylenol.  You may take 650 mg every 6 hours as needed for discomfort.  Home care instructions:  Please follow any educational materials contained in this packet.   Follow-up instructions: Please call physicians for women today or tomorrow to make an appointment.  Return instructions:  Please return to the Emergency Department if you experience worsening symptoms.  [facility] if you have an increase in bleeding from the vagina that is significantly increased above the baseline now, worsening pain, or fever with belly pain. Please return if you have any other emergent concerns.  Additional Information:   Your vital signs today were: BP 122/62 (BP Location: Right Arm)    Pulse 85    Temp 98 F (36.7 C) (Oral)    Resp 18    Ht 5' 5.5" (1.664 m)    Wt 102.1 kg (225 lb)    LMP  (LMP Unknown)    SpO2 100%    BMI 36.87 kg/m  If your blood pressure (BP) was elevated on multiple readings during this visit above 130 for the top number or above 80 for the bottom number, please have this repeated by your primary care provider within one month. --------------  Thank you for allowing us to participate in your care today.

## 2017-12-11 NOTE — ED Notes (Signed)
Patient transported to [facility] 

## 2017-12-11 NOTE — ED Notes (Addendum)
Pt given ginger ale ,graham crackers, and peanut butter per Alyssa- PA

## 2017-12-11 NOTE — ED Triage Notes (Signed)
Pt endorses lower abd pain with back pain and has been having intermittent "blackish discharge" for 2 weeks. Went to pcp this morning and had a positive pregnancy test and sent here for "possible miscarriage and birth in my tubes" VSS.

## 2017-12-11 NOTE — ED Provider Notes (Signed)
[facility] Provider Note   CSN: [id number] Arrival date & time: 12/11/17  1110     History   Chief Complaint Chief Complaint  Patient presents with  . Threatened Miscarriage    HPI Barbara Zimmerman is a 27 y.o. female.  HPI  Patient is a 27 year old female, [id number] presenting for vaginal bleeding, bilateral lower abdominal cramping and low back pain.  Patient reports she presented to her memory care provider this morning, but a positive pregnancy test, referred her to the emergency department to rule out ectopic pregnancy versus miscarriage.  Patient reports that her last normal menstrual period may have been in May but she is not sure.  Patient reports that she stopped oral contraceptive pills while taking amoxicillin.  Patient reports that she has had small to intermediate amounts of vaginal bleeding for the past 2 weeks, rarely bright red in color, and usually brown to dark clots.  Patient reports that over the past few days, she first began having some intermittent crampy abdominal pain on the right side followed by the left side.  Patient reports nausea without vomiting.  No fever or chills.  Patient reports normal bowel movements over this interval. Patient denies increased vaginal discharge.  Patient reports that last pregnancy was 7 years ago, premature and vaginally delivered.  Patient reports having a medical abortion last year.  Past Medical History:  Diagnosis Date  . Asthma    childhood  . Post partum depression   . Tonsillitis     There are no active problems to display for this patient.   Past Surgical History:  Procedure Laterality Date  . ABDOMINAL SURGERY    . c secion    . CESAREAN SECTION       OB History    Gravida  1   Para      Term      Preterm      AB      Living        SAB      TAB      Ectopic      Multiple      Live Births               Home Medications    Prior to Admission  medications   Medication Sig Start Date End Date Taking? Authorizing Provider  amoxicillin-clavulanate (AUGMENTIN) 875-125 MG tablet Take 1 tablet by mouth 2 (two) times daily. One po bid x 7 days 09/19/16   Tomasita Crumbleni, Adeleke, MD  clindamycin (CLEOCIN) 300 MG capsule Take 1 capsule (300 mg total) by mouth 4 (four) times daily. X 7 days 09/20/16   Geoffery Lyonselo, Douglas, MD  oxyCODONE-acetaminophen (PERCOCET) 5-325 MG tablet Take 2 tablets by mouth every 6 (six) hours as needed. 09/20/16   Geoffery Lyonselo, Douglas, MD  predniSONE (DELTASONE) 10 MG tablet Take 2 tablets (20 mg total) by mouth 2 (two) times daily with a meal. 09/20/16   Geoffery Lyonselo, Douglas, MD    Family History Family History  Problem Relation Age of Onset  . Diabetes Mother     Social History Social History   Tobacco Use  . Smoking status: Current Every Day Smoker    Packs/day: 0.50    Years: 5.00    Pack years: 2.50    Types: Cigarettes  . Smokeless tobacco: Never Used  Substance Use Topics  . Alcohol use: Yes    Comment: occ  . Drug use: No     Allergies  Oysters [shellfish allergy]   Review of Systems Review of Systems  Constitutional: Negative for chills and fever.  Respiratory: Negative for cough and shortness of breath.   Cardiovascular: Negative for palpitations.  Gastrointestinal: Positive for abdominal pain and nausea. Negative for vomiting.  Genitourinary: Positive for pelvic pain and vaginal bleeding. Negative for difficulty urinating, dysuria, vaginal discharge and vaginal pain.  Neurological: Positive for dizziness.  All other systems reviewed and are negative.    Physical Exam Updated Vital Signs BP 122/62 (BP Location: Right Arm)   Pulse 85   Temp 98 F (36.7 C) (Oral)   Resp 18   Ht 5' 5.5" (1.664 m)   Wt 102.1 kg (225 lb)   LMP  (LMP Unknown)   SpO2 100%   BMI 36.87 kg/m   Physical Exam  Constitutional: She appears well-developed and well-nourished. No distress.  HENT:  Head: Normocephalic and atraumatic.   Mouth/Throat: Oropharynx is clear and moist.  Eyes: Pupils are equal, round, and reactive to light. Conjunctivae and EOM are normal.  Neck: Normal range of motion. Neck supple.  Cardiovascular: Normal rate, regular rhythm, S1 normal and S2 normal.  No murmur heard. Pulmonary/Chest: Effort normal and breath sounds normal. She has no wheezes. She has no rales.  Abdominal: Soft. She exhibits no distension. There is tenderness. There is no guarding.  Discomfort to palpation all across the lower abdomen without focality.  Genitourinary:  Genitourinary Comments: Examination performed with EMT chaperone present.  Patient has closed os, with no erythema in vaginal vault. On bimanual exam, patient has no cervical motion tenderness, and has discomfort to palpation but no exquisite adnexal tenderness bilaterally.  Musculoskeletal: Normal range of motion. She exhibits no edema or deformity.  Lymphadenopathy:    She has no cervical adenopathy.  Neurological: She is alert.  Cranial nerves grossly intact. Patient moves extremities symmetrically and with good coordination.  Skin: Skin is warm and dry. No rash noted. No erythema.  Psychiatric: She has a normal mood and affect. Her behavior is normal. Judgment and thought content normal.  Nursing note and vitals reviewed.    ED Treatments / Results  Labs (all labs ordered are listed, but only abnormal results are displayed) Labs Reviewed  WET PREP, GENITAL - Abnormal; Notable for the following components:      Result Value   WBC, Wet Prep HPF POC MODERATE (*)    All other components within normal limits  HCG, QUANTITATIVE, PREGNANCY - Abnormal; Notable for the following components:   hCG, Beta Chain, Quant, S 90,443 (*)    All other components within normal limits  URINALYSIS, ROUTINE W REFLEX MICROSCOPIC - Abnormal; Notable for the following components:   APPearance HAZY (*)    Hgb urine dipstick SMALL (*)    Ketones, ur 20 (*)    Bacteria, UA  FEW (*)    All other components within normal limits  I-STAT BETA HCG BLOOD, ED ([facility], [facility], [facility] ONLY) - Abnormal; Notable for the following components:   I-stat hCG, quantitative >2,000.0 (*)    All other components within normal limits  CBC WITH DIFFERENTIAL/PLATELET  BASIC METABOLIC PANEL  ABO/RH  GC/CHLAMYDIA PROBE AMP ([place]) NOT AT [facility]    EKG None  Radiology US Ob Comp Less 14 Wks  Result Date: 12/11/2017 CLINICAL DATA:  Vaginal bleeding. Gestational age by LMP of 6 weeks 4 days. EXAM: OBSTETRIC <14 WK [place] AND TRANSVAGINAL OB US TECHNIQUE: Both transabdominal and transvaginal ultrasound examinations were performed for complete evaluation  of the gestation as well as the maternal uterus, adnexal regions, and pelvic cul-de-sac. Transvaginal technique was performed to assess early pregnancy. COMPARISON:  None. FINDINGS: Intrauterine gestational sac: Single Yolk sac:  Visualized. Embryo:  Visualized. Cardiac Activity: Visualized. Heart Rate: 117 bpm CRL:  8 mm   6 w   5 d                  [place] EDC: 08/01/2018 Subchorionic hemorrhage:  None visualized. Maternal uterus/adnexae: Right ovary is normal appearance. Left ovary is not directly visualized however no mass or abnormal free fluid identified. IMPRESSION: Single living IUP measuring 6 weeks 5 days, with [place] EDC of 08/01/2018. No significant maternal uterine or adnexal abnormality identified. Electronically Signed   By: Myles Rosenthal M.D.   On: 12/11/2017 13:53   US Ob Transvaginal  Result Date: 12/11/2017 CLINICAL DATA:  Vaginal bleeding. Gestational age by LMP of 6 weeks 4 days. EXAM: OBSTETRIC <14 WK [place] AND TRANSVAGINAL OB US TECHNIQUE: Both transabdominal and transvaginal ultrasound examinations were performed for complete evaluation of the gestation as well as the maternal uterus, adnexal regions, and pelvic cul-de-sac. Transvaginal technique was performed to assess early pregnancy. COMPARISON:  None. FINDINGS: Intrauterine gestational sac:  Single Yolk sac:  Visualized. Embryo:  Visualized. Cardiac Activity: Visualized. Heart Rate: 117 bpm CRL:  8 mm   6 w   5 d                  [place] EDC: 08/01/2018 Subchorionic hemorrhage:  None visualized. Maternal uterus/adnexae: Right ovary is normal appearance. Left ovary is not directly visualized however no mass or abnormal free fluid identified. IMPRESSION: Single living IUP measuring 6 weeks 5 days, with [place] EDC of 08/01/2018. No significant maternal uterine or adnexal abnormality identified. Electronically Signed   By: Myles Rosenthal M.D.   On: 12/11/2017 13:53    Procedures Procedures (including critical care time)  Medications Ordered in ED Medications - No data to display   Initial Impression / Assessment and Plan / ED Course  I have reviewed the triage vital signs and the nursing notes.  Pertinent labs & imaging results that were available during my care of the patient were reviewed by me and considered in my medical decision making (see chart for details).  Clinical Course as of Dec 11 1532  Wed Dec 11, 2017  1522 Well-appearing.  No complaints at time of discharge.  Discussed with patient to receive a call for any positive results for gonorrhea and chlamydia.  Patient reports she was just tested for HIV and syphilis, and deferred further testing of this today.  Given that patient is B+, does not require RhoGam.   [AM]  1534 Patient reports she would prefer to reach out to [facility].  She would like to establish to follow-up herself.   [AM]    Clinical Course User Index [AM] Elisha Ponder, PA-C    Patient nontoxic-appearing, hemodynamically stable and in no acute distress.  Differential diagnosis includes threatened abortion versus inevitable abortion versus ectopic pregnancy.  Do not suspect appendicitis or ovarian torsion given the crampy and mild nature of patient's pain as well as its intermittent nature.    Pelvic ultrasound demonstrates intrauterine pregnancy  approximately 6 weeks 5 days.  Heart rate 117.  Given the persistence of patient's bleeding, this may still be a threatened miscarriage although the os is closed.  Patient also exhibits a symptomatic bacteriuria.  Will treat with Keflex.  I  discussed with patient that she will receive a call for any positive results for gonorrhea and chlamydia, however clinically I do not suspect this.  Patient is establishing care with her prior OB/GYN, and will establish recheck appointment.  Patient was given return precautions to go to [facility] hospital for any significant increase in bleeding above her baseline.  Patient is in understanding and agrees with the plan of care.  This is a supervised visit with Dr. Shaune Pollack. Evaluation, management, and discharge planning discussed with this attending physician.  Final Clinical Impressions(s) / ED Diagnoses   Final diagnoses:  Threatened miscarriage  Bacteria in urine  First trimester pregnancy    ED Discharge Orders        Ordered    cephALEXin (KEFLEX) 500 MG capsule  2 times daily     12/11/17 1528    Prenatal Vit-Fe Fumarate-FA (PRENATAL VITAMIN) 27-0.8 MG TABS  Daily     12/11/17 1528       Delia Chimes 12/11/17 1536    Shaune Pollack, MD 12/12/17 1020

## 2017-12-12 LAB — GC/CHLAMYDIA PROBE AMP (~~LOC~~) NOT AT ARMC
Chlamydia: NEGATIVE
Neisseria Gonorrhea: NEGATIVE

## 2018-04-22 ENCOUNTER — Other Ambulatory Visit (HOSPITAL_COMMUNITY): Payer: Self-pay | Admitting: Family

## 2018-06-24 ENCOUNTER — Emergency Department (HOSPITAL_COMMUNITY)
Admission: EM | Admit: 2018-06-24 | Discharge: 2018-06-24 | Disposition: A | Discharge status: Home / Self Care | Payer: Medicaid Other | Attending: Emergency Medicine | Admitting: Emergency Medicine

## 2018-06-24 ENCOUNTER — Emergency Department (HOSPITAL_COMMUNITY): Payer: Medicaid Other

## 2018-06-24 DIAGNOSIS — Y9301 Activity, walking, marching and hiking: Secondary | ICD-10-CM | POA: Insufficient documentation

## 2018-06-24 DIAGNOSIS — X500XXA Overexertion from strenuous movement or load, initial encounter: Secondary | ICD-10-CM | POA: Diagnosis not present

## 2018-06-24 DIAGNOSIS — F17210 Nicotine dependence, cigarettes, uncomplicated: Secondary | ICD-10-CM | POA: Diagnosis not present

## 2018-06-24 DIAGNOSIS — S99911A Unspecified injury of right ankle, initial encounter: Secondary | ICD-10-CM | POA: Diagnosis present

## 2018-06-24 DIAGNOSIS — S93401A Sprain of unspecified ligament of right ankle, initial encounter: Secondary | ICD-10-CM | POA: Diagnosis not present

## 2018-06-24 DIAGNOSIS — Y929 Unspecified place or not applicable: Secondary | ICD-10-CM | POA: Insufficient documentation

## 2018-06-24 DIAGNOSIS — Y999 Unspecified external cause status: Secondary | ICD-10-CM | POA: Diagnosis not present

## 2018-06-24 LAB — POC URINE PREG, ED: Preg Test, Ur: NEGATIVE

## 2018-06-24 MED ORDER — OXYCODONE-ACETAMINOPHEN 5-325 MG PO TABS
1.0000 | ORAL_TABLET | Freq: Once | ORAL | Status: AC
  Administered 2018-06-24: 1 via ORAL
  Filled 2018-06-24: qty 1

## 2018-06-24 MED ORDER — ACETAMINOPHEN 325 MG PO TABS: 650.0000 mg | ORAL_TABLET | Freq: Once | ORAL | Status: DC

## 2018-06-24 MED ORDER — NAPROXEN 500 MG PO TABS: 500.0000 mg | ORAL_TABLET | Freq: Two times a day (BID) | ORAL | 0 refills | Status: DC

## 2018-06-24 NOTE — ED Triage Notes (Signed)
Pt here with c/o right ankle pain swelling and pain with movement

## 2018-06-24 NOTE — ED Provider Notes (Signed)
[facility]   CSN: [id number] Arrival date & time: 06/24/18  1050     History   Chief Complaint No chief complaint on file.   HPI Barbara Zimmerman is a 28 y.o. female who presents to ED for right ankle pain after injury that occurred on 06/22/2018.  States the pain is aching, sharp and radiates to the back of her ankle.  She was going up the stairs and playing with her young son when she felt as though she may have twisted her ankle.  She does not remember the exact circumstances because "it happened so fast."  She has had progressive worsening pain since then.  She has been taking ibuprofen with no improvement in her symptoms.  Reports history of prior sprains in this ankle but denies any prior surgeries, fractures or dislocations.  She is reports improvement with using her mother's cane.  HPI  Past Medical History:  Diagnosis Date  . Asthma    childhood  . Post partum depression   . Tonsillitis     There are no active problems to display for this patient.   Past Surgical History:  Procedure Laterality Date  . ABDOMINAL SURGERY    . c secion    . CESAREAN SECTION       OB History    Gravida  1   Para      Term      Preterm      AB      Living        SAB      TAB      Ectopic      Multiple      Live Births               Home Medications    Prior to Admission medications   Medication Sig Start Date End Date Taking? Authorizing Provider  amoxicillin-clavulanate (AUGMENTIN) 875-125 MG tablet Take 1 tablet by mouth 2 (two) times daily. One po bid x 7 days 09/19/16   Tomasita Crumbleni, Adeleke, MD  cephALEXin (KEFLEX) 500 MG capsule Take 1 capsule (500 mg total) by mouth 2 (two) times daily. 12/11/17   Aviva KluverMurray, Alyssa B, PA-C  clindamycin (CLEOCIN) 300 MG capsule Take 1 capsule (300 mg total) by mouth 4 (four) times daily. X 7 days 09/20/16   Geoffery Lyonselo, Douglas, MD  naproxen (NAPROSYN) 500 MG tablet Take 1 tablet (500 mg  total) by mouth 2 (two) times daily. 06/24/18   Tiana Sivertson, PA-C  oxyCODONE-acetaminophen (PERCOCET) 5-325 MG tablet Take 2 tablets by mouth every 6 (six) hours as needed. 09/20/16   Geoffery Lyonselo, Douglas, MD  predniSONE (DELTASONE) 10 MG tablet Take 2 tablets (20 mg total) by mouth 2 (two) times daily with a meal. 09/20/16   Geoffery Lyonselo, Douglas, MD  Prenatal Vit-Fe Fumarate-FA (PRENATAL VITAMIN) 27-0.8 MG TABS Take 1 tablet by mouth daily. 12/11/17   Elisha PonderMurray, Alyssa B, PA-C    Family History Family History  Problem Relation Age of Onset  . Diabetes Mother     Social History Social History   Tobacco Use  . Smoking status: Current Every Day Smoker    Packs/day: 0.50    Years: 5.00    Pack years: 2.50    Types: Cigarettes  . Smokeless tobacco: Never Used  Substance Use Topics  . Alcohol use: Yes    Comment: occ  . Drug use: No     Allergies   Oysters [shellfish allergy]  Review of Systems Review of Systems  Constitutional: Negative for chills and fever.  Musculoskeletal: Positive for arthralgias and joint swelling. Negative for gait problem and myalgias.  Neurological: Negative for weakness and numbness.     Physical Exam Updated Vital Signs BP (!) 116/55 (BP Location: Left Arm)   Pulse 73   Temp 97.6 F (36.4 C)   Resp 16   LMP 05/14/2018 (Approximate)   SpO2 100%   Physical Exam Vitals signs and nursing note reviewed.  Constitutional:      General: She is not in acute distress.    Appearance: She is well-developed. She is not diaphoretic.  HENT:     Head: Normocephalic and atraumatic.  Eyes:     General: No scleral icterus.    Conjunctiva/sclera: Conjunctivae normal.  Neck:     Musculoskeletal: Normal range of motion.  Pulmonary:     Effort: Pulmonary effort is normal. No respiratory distress.  Musculoskeletal:     Left foot: Tenderness, bony tenderness and swelling present. No crepitus or deformity.       Feet:     Comments: Negative Thompson test.  Diffuse  tenderness to palpation and edema of the right lateral and medial ankle.  No ecchymosis or color change noted.  2+ DP pulse palpated.  Able to move toes without difficulty.  Range of motion of ankle is limited although able to perform with pain.  No warmth of joint.  Skin:    Findings: No rash.  Neurological:     Mental Status: She is alert.      ED Treatments / Results  Labs (all labs ordered are listed, but only abnormal results are displayed) Labs Reviewed  POC URINE PREG, ED    EKG None  Radiology Dg Ankle Complete Right  Result Date: 06/24/2018 CLINICAL DATA:  Twisted ankle 3 days ago. EXAM: RIGHT ANKLE - COMPLETE 3+ VIEW COMPARISON:  None. FINDINGS: The ankle mortise is maintained. No acute ankle fracture or osteochondral abnormality. No definite joint effusion. There is a moderate-sized os trigonum noted. The mid and hindfoot bony structures are intact. IMPRESSION: No acute bony findings. Electronically Signed   By: Rudie Meyer M.D.   On: 06/24/2018 12:19    Procedures Procedures (including critical care time)  Medications Ordered in ED Medications  oxyCODONE-acetaminophen (PERCOCET/ROXICET) 5-325 MG per tablet 1 tablet (1 tablet Oral Given 06/24/18 1341)     Initial Impression / Assessment and Plan / ED Course  I have reviewed the triage vital signs and the nursing notes.  Pertinent labs & imaging results that were available during my care of the patient were reviewed by me and considered in my medical decision making (see chart for details).     28 year old female presents to ED for right ankle pain after injury that occurred 2 days ago.  She does not remember the exact mechanism of injury.  She notes pain throughout her entire right ankle and posterior ankle.  There is swelling and diffuse tenderness palpation noted on my exam.  Areas are neurovascularly intact.  There is a negative Thompson's test.  Able to move digits without difficulty.  X-rays negative.   Suspect that symptoms are due to sprain.  Will give ankle brace, crutches as needed and rice therapy.  Will give orthopedic follow-up.  Patient is hemodynamically stable, in NAD, and able to ambulate in the ED. Evaluation does not show pathology that would require ongoing emergent intervention or inpatient treatment. I explained the diagnosis to the patient. Pain  has been managed and has no complaints prior to discharge. Patient is comfortable with above plan and is stable for discharge at this time. All questions were answered prior to disposition. Strict return precautions for returning to the ED were discussed. Encouraged follow up with PCP.    Portions of this note were generated with Scientist, clinical (histocompatibility and immunogenetics)Dragon dictation software. Dictation errors may occur despite best attempts at proofreading.   Final Clinical Impressions(s) / ED Diagnoses   Final diagnoses:  Sprain of right ankle, unspecified ligament, initial encounter    ED Discharge Orders         Ordered    naproxen (NAPROSYN) 500 MG tablet  2 times daily     06/24/18 1402           Dietrich PatesKhatri, Bretton Tandy, PA-C 06/24/18 1404    Doug SouJacubowitz, Sam, MD 06/24/18 [phone]

## 2018-06-24 NOTE — Discharge Instructions (Addendum)
You will need to follow-up with the orthopedist. Return to ED for worsening symptoms, increased swelling, redness, warmth or additional injury.

## 2020-06-24 ENCOUNTER — Other Ambulatory Visit: Payer: Self-pay | Admitting: Family Medicine

## 2020-06-24 ENCOUNTER — Encounter (HOSPITAL_COMMUNITY): Payer: Self-pay

## 2020-06-24 ENCOUNTER — Ambulatory Visit (HOSPITAL_COMMUNITY): Admission: RE | Admit: 2020-06-24 | Payer: Medicaid Other | Source: Ambulatory Visit

## 2020-06-24 DIAGNOSIS — R109 Unspecified abdominal pain: Secondary | ICD-10-CM

## 2020-06-24 DIAGNOSIS — N939 Abnormal uterine and vaginal bleeding, unspecified: Secondary | ICD-10-CM

## 2020-06-29 ENCOUNTER — Ambulatory Visit (HOSPITAL_COMMUNITY)
Admission: RE | Admit: 2020-06-29 | Discharge: 2020-06-29 | Disposition: A | Discharge status: Home / Self Care | Payer: Medicaid Other | Source: Ambulatory Visit | Attending: Family Medicine | Admitting: Family Medicine

## 2020-06-29 ENCOUNTER — Other Ambulatory Visit: Payer: Self-pay

## 2020-06-29 DIAGNOSIS — N939 Abnormal uterine and vaginal bleeding, unspecified: Secondary | ICD-10-CM | POA: Diagnosis present

## 2020-06-29 DIAGNOSIS — R109 Unspecified abdominal pain: Secondary | ICD-10-CM | POA: Diagnosis present

## 2020-07-19 ENCOUNTER — Other Ambulatory Visit (HOSPITAL_COMMUNITY)
Admission: RE | Admit: 2020-07-19 | Discharge: 2020-07-19 | Disposition: A | Discharge status: Home / Self Care | Payer: Medicaid Other | Source: Ambulatory Visit | Attending: Obstetrics | Admitting: Obstetrics

## 2020-07-19 ENCOUNTER — Ambulatory Visit (INDEPENDENT_AMBULATORY_CARE_PROVIDER_SITE_OTHER): Payer: Medicaid Other

## 2020-07-19 ENCOUNTER — Other Ambulatory Visit: Payer: Self-pay

## 2020-07-19 DIAGNOSIS — N760 Acute vaginitis: Secondary | ICD-10-CM | POA: Insufficient documentation

## 2020-07-19 DIAGNOSIS — N898 Other specified noninflammatory disorders of vagina: Secondary | ICD-10-CM | POA: Insufficient documentation

## 2020-07-19 DIAGNOSIS — Z113 Encounter for screening for infections with a predominantly sexual mode of transmission: Secondary | ICD-10-CM | POA: Insufficient documentation

## 2020-07-19 DIAGNOSIS — B9689 Other specified bacterial agents as the cause of diseases classified elsewhere: Secondary | ICD-10-CM | POA: Insufficient documentation

## 2020-07-19 NOTE — Progress Notes (Signed)
Patient presents for a self swab and STD testing. Patient complains pf having vaginal odor. Sx has been present inconsistently for about 3 weeks. Patient has no other concerns.  Self swab completed. Patient advised that results will take 24-48 hours to return. Patient verbalized understanding.

## 2020-07-20 LAB — CERVICOVAGINAL ANCILLARY ONLY
Bacterial Vaginitis (gardnerella): POSITIVE — AB
Candida Glabrata: NEGATIVE
Candida Vaginitis: NEGATIVE
Chlamydia: NEGATIVE
Comment: NEGATIVE
Comment: NEGATIVE
Comment: NEGATIVE
Comment: NEGATIVE
Comment: NEGATIVE
Comment: NORMAL
Neisseria Gonorrhea: NEGATIVE
Trichomonas: NEGATIVE

## 2020-07-21 ENCOUNTER — Other Ambulatory Visit: Payer: Self-pay | Admitting: Obstetrics

## 2020-07-21 DIAGNOSIS — N760 Acute vaginitis: Secondary | ICD-10-CM

## 2020-07-21 DIAGNOSIS — B9689 Other specified bacterial agents as the cause of diseases classified elsewhere: Secondary | ICD-10-CM

## 2020-07-21 MED ORDER — METRONIDAZOLE 500 MG PO TABS: 500.0000 mg | ORAL_TABLET | Freq: Two times a day (BID) | ORAL | 0 refills | Status: DC

## 2020-07-25 ENCOUNTER — Other Ambulatory Visit: Payer: Self-pay | Admitting: Obstetrics

## 2020-07-25 DIAGNOSIS — N760 Acute vaginitis: Secondary | ICD-10-CM

## 2020-07-25 DIAGNOSIS — B9689 Other specified bacterial agents as the cause of diseases classified elsewhere: Secondary | ICD-10-CM

## 2020-07-25 MED ORDER — CLINDAMYCIN HCL 300 MG PO CAPS: 300.0000 mg | ORAL_CAPSULE | Freq: Three times a day (TID) | ORAL | 0 refills | Status: DC

## 2020-08-16 ENCOUNTER — Ambulatory Visit: Payer: Medicaid Other | Admitting: Medical

## 2020-08-19 ENCOUNTER — Encounter: Payer: Self-pay | Admitting: Obstetrics

## 2020-08-19 ENCOUNTER — Other Ambulatory Visit: Payer: Self-pay

## 2020-08-19 ENCOUNTER — Ambulatory Visit (INDEPENDENT_AMBULATORY_CARE_PROVIDER_SITE_OTHER): Payer: Medicaid Other | Admitting: Obstetrics

## 2020-08-19 ENCOUNTER — Other Ambulatory Visit (HOSPITAL_COMMUNITY)
Admission: RE | Admit: 2020-08-19 | Discharge: 2020-08-19 | Disposition: A | Discharge status: Home / Self Care | Payer: Medicaid Other | Source: Ambulatory Visit | Attending: Obstetrics | Admitting: Obstetrics

## 2020-08-19 ENCOUNTER — Ambulatory Visit: Payer: Medicaid Other | Admitting: Obstetrics

## 2020-08-19 VITALS — BP 133/83 | HR 72 | Ht 66.0 in | Wt 209.7 lb

## 2020-08-19 DIAGNOSIS — B373 Candidiasis of vulva and vagina: Secondary | ICD-10-CM | POA: Diagnosis not present

## 2020-08-19 DIAGNOSIS — N760 Acute vaginitis: Secondary | ICD-10-CM | POA: Diagnosis not present

## 2020-08-19 DIAGNOSIS — N898 Other specified noninflammatory disorders of vagina: Secondary | ICD-10-CM | POA: Diagnosis present

## 2020-08-19 DIAGNOSIS — B9689 Other specified bacterial agents as the cause of diseases classified elsewhere: Secondary | ICD-10-CM

## 2020-08-19 DIAGNOSIS — Z3041 Encounter for surveillance of contraceptive pills: Secondary | ICD-10-CM | POA: Diagnosis not present

## 2020-08-19 DIAGNOSIS — B3731 Acute candidiasis of vulva and vagina: Secondary | ICD-10-CM

## 2020-08-19 MED ORDER — TRI-ESTARYLLA 0.18/0.215/0.25 MG-35 MCG PO TABS: 1.0000 | ORAL_TABLET | Freq: Every day | ORAL | 11 refills | Status: DC

## 2020-08-19 MED ORDER — METRONIDAZOLE 0.75 % VA GEL: 1.0000 | Freq: Two times a day (BID) | VAGINAL | 5 refills | Status: DC

## 2020-08-19 MED ORDER — FLUCONAZOLE 200 MG PO TABS: 200.0000 mg | ORAL_TABLET | ORAL | 2 refills | Status: DC

## 2020-08-19 MED ORDER — LEVONORGESTREL-ETHINYL ESTRAD 0.15-30 MG-MCG PO TABS: 1.0000 | ORAL_TABLET | Freq: Every day | ORAL | 11 refills | Status: DC

## 2020-08-19 NOTE — Progress Notes (Signed)
Pt c/o vaginal itching, and reports an odor.

## 2020-08-19 NOTE — Progress Notes (Signed)
Patient ID: Barbara Zimmerman, female   DOB: [birth year], 30 y.o.   MRN: [id number]  No chief complaint on file.   HPI Barbara Zimmerman is a 30 y.o. female.  Complains of vaginal discharge with odor and itching. HPI  Past Medical History:  Diagnosis Date  . Asthma    childhood  . Post partum depression   . Tonsillitis     Past Surgical History:  Procedure Laterality Date  . ABDOMINAL SURGERY    . c secion    . CESAREAN SECTION      Family History  Problem Relation Age of Onset  . Diabetes Mother     Social History Social History   Tobacco Use  . Smoking status: Current Every Day Smoker    Packs/day: 0.50    Years: 5.00    Pack years: 2.50    Types: Cigarettes  . Smokeless tobacco: Never Used  Vaping Use  . Vaping Use: Never used  Substance Use Topics  . Alcohol use: Not Currently  . Drug use: No    Allergies  Allergen Reactions  . Flagyl [Metronidazole] Itching    Pt reports allergic to pill form only.  Laqueta Jean Allergy] Swelling    clams    Current Outpatient Medications  Medication Sig Dispense Refill  . fluconazole (DIFLUCAN) 200 MG tablet Take 1 tablet (200 mg total) by mouth every 3 (three) days. 3 tablet 2  . levonorgestrel-ethinyl estradiol (NORDETTE) 0.15-30 MG-MCG tablet Take 1 tablet by mouth daily. 28 tablet 11  . metroNIDAZOLE (METROGEL VAGINAL) 0.75 % vaginal gel Place 1 Applicatorful vaginally 2 (two) times daily. 70 g 5  . clindamycin (CLEOCIN) 300 MG capsule Take 1 capsule (300 mg total) by mouth 3 (three) times daily. (Patient not taking: Reported on 08/19/2020) 21 capsule 0   No current facility-administered medications for this visit.    Review of Systems Review of Systems Constitutional: negative for fatigue and weight loss Respiratory: negative for cough and wheezing Cardiovascular: negative for chest pain, fatigue and palpitations Gastrointestinal: negative for abdominal pain and change in bowel habits Genitourinary:  positive for vaginal discharge with odor and itching Integument/breast: negative for nipple discharge Musculoskeletal:negative for myalgias Neurological: negative for gait problems and tremors Behavioral/Psych: negative for abusive relationship, depression Endocrine: negative for temperature intolerance      Blood pressure 133/83, pulse 72, height 5\' 6"  (1.676 m), weight 209 lb 11.2 oz (95.1 kg).  Physical Exam Physical Exam General:   alert and no distress  Skin:   no rash or abnormalities  Lungs:   clear to auscultation bilaterally  Heart:   regular rate and rhythm, S1, S2 normal, no murmur, click, rub or gallop  Breasts:   not examined  Abdomen:  normal findings: no organomegaly, soft, non-tender and no hernia  Pelvis:  External genitalia: normal general appearance Urinary system: urethral meatus normal and bladder without fullness, nontender Vaginal: normal without tenderness, induration or masses Cervix: normal appearance Adnexa: normal bimanual exam Uterus: anteverted and non-tender, normal size    I have spent a total of 15 minutes of face-to-face time, excluding clinical staff time, reviewing notes and preparing to see patient, ordering tests and/or medications, and counseling the patient.  Data Reviewed Wet Prep and Cultures  Assessment     1. Vaginal discharge Rx: - Cervicovaginal ancillary only  2. BV (bacterial vaginosis) Rx: - metroNIDAZOLE (METROGEL VAGINAL) 0.75 % vaginal gel; Place 1 Applicatorful vaginally 2 (two) times daily.  Dispense: 70 g; Refill:  5  3. Candida vaginitis Rx: - fluconazole (DIFLUCAN) 200 MG tablet; Take 1 tablet (200 mg total) by mouth every 3 (three) days.  Dispense: 3 tablet; Refill: 2  4. Encounter for surveillance of contraceptive pills Rx: - levonorgestrel-ethinyl estradiol (NORDETTE) 0.15-30 MG-MCG tablet; Take 1 tablet by mouth daily.  Dispense: 28 tablet; Refill: 11    Plan   Follow up in 3 months for Annual / Pap   Meds  ordered this encounter  Medications  . metroNIDAZOLE (METROGEL VAGINAL) 0.75 % vaginal gel    Sig: Place 1 Applicatorful vaginally 2 (two) times daily.    Dispense:  70 g    Refill:  5  . fluconazole (DIFLUCAN) 200 MG tablet    Sig: Take 1 tablet (200 mg total) by mouth every 3 (three) days.    Dispense:  3 tablet    Refill:  2  . DISCONTD: TRI-ESTARYLLA 0.18/0.215/0.25 MG-35 MCG tablet    Sig: Take 1 tablet by mouth daily.    Dispense:  28 tablet    Refill:  11  . levonorgestrel-ethinyl estradiol (NORDETTE) 0.15-30 MG-MCG tablet    Sig: Take 1 tablet by mouth daily.    Dispense:  28 tablet    Refill:  11     Brock Bad, MD 08/19/2020 9:37 AM

## 2020-08-22 LAB — CERVICOVAGINAL ANCILLARY ONLY
Bacterial Vaginitis (gardnerella): NEGATIVE
Candida Glabrata: NEGATIVE
Candida Vaginitis: POSITIVE — AB
Chlamydia: NEGATIVE
Comment: NEGATIVE
Comment: NEGATIVE
Comment: NEGATIVE
Comment: NEGATIVE
Comment: NEGATIVE
Comment: NORMAL
Neisseria Gonorrhea: NEGATIVE
Trichomonas: NEGATIVE

## 2020-08-23 ENCOUNTER — Other Ambulatory Visit: Payer: Self-pay | Admitting: Obstetrics

## 2020-08-23 ENCOUNTER — Telehealth: Payer: Self-pay

## 2020-08-23 NOTE — Telephone Encounter (Signed)
-----   Message from Brock Bad, MD sent at 08/23/2020 12:32 PM EDT ----- Diflucan Rx for yeast

## 2020-08-23 NOTE — Telephone Encounter (Signed)
Call patient to inform her of test result.

## 2021-05-10 IMAGING — US US PELVIS COMPLETE WITH TRANSVAGINAL
1 series · 14 of 25 positions shown · non-contrast
Comparison: OB ultrasound dated December 11, 2017.

CLINICAL DATA: Abdominal pain and vaginal bleeding.



[Series 1: us pelvis complete with transvaginal · 14 of 62 slices shown]
[im 1/62]
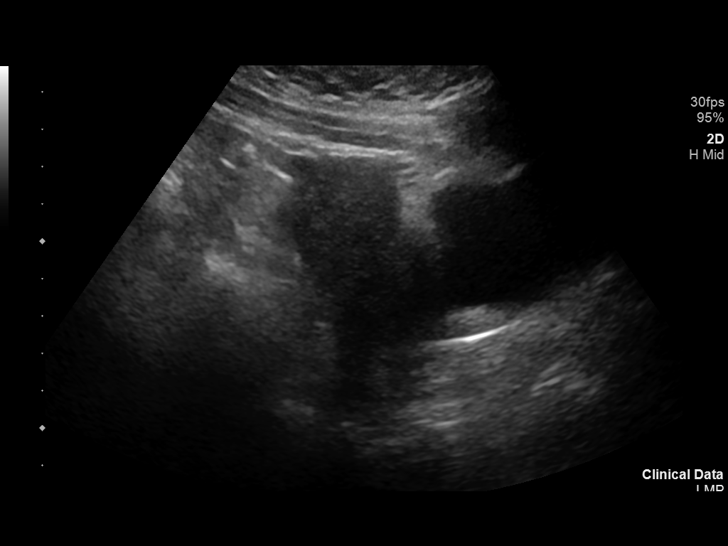
[im 6/62]
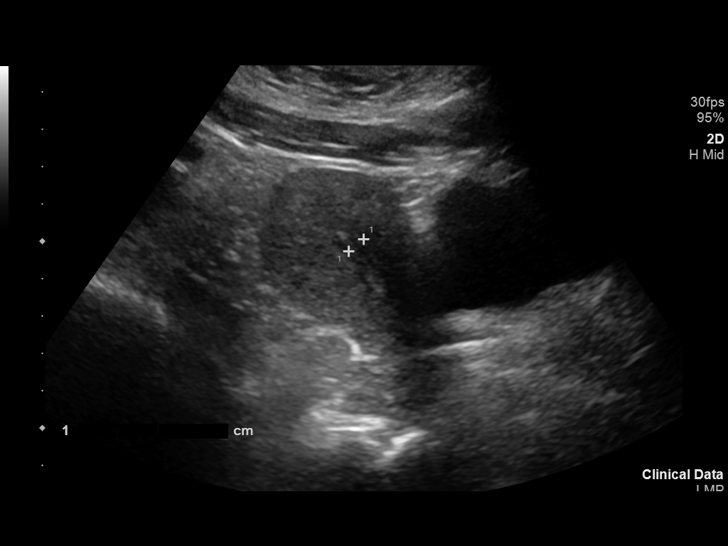
[im 11/62]
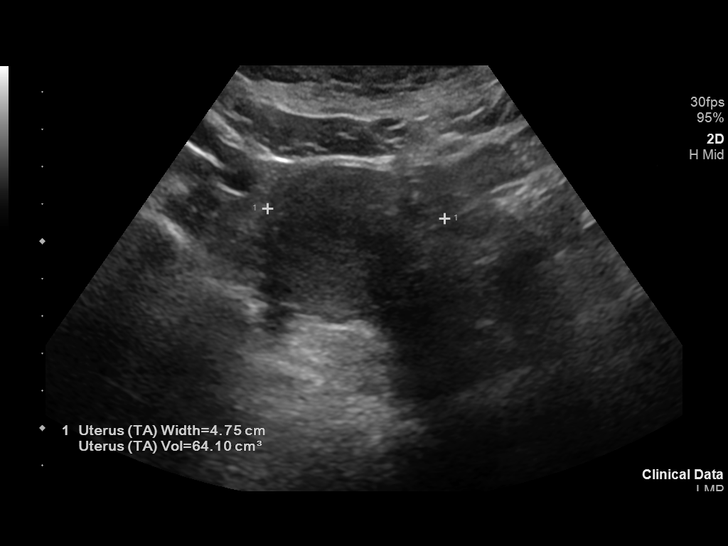
[im 16/62]
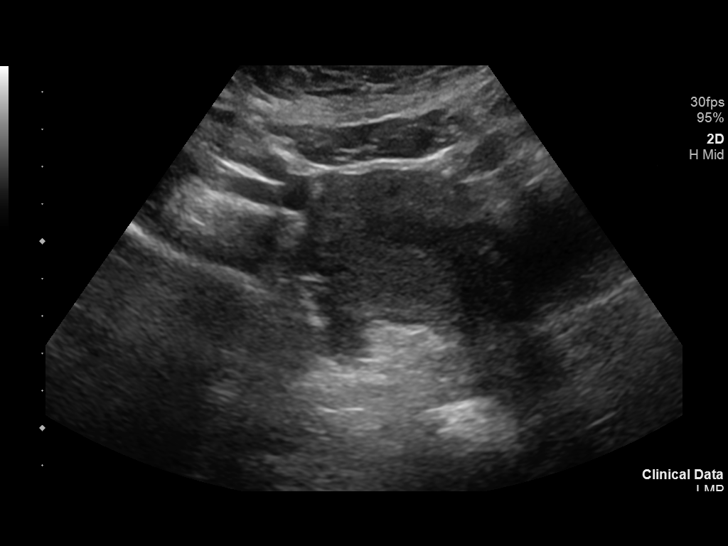
[im 21/62]
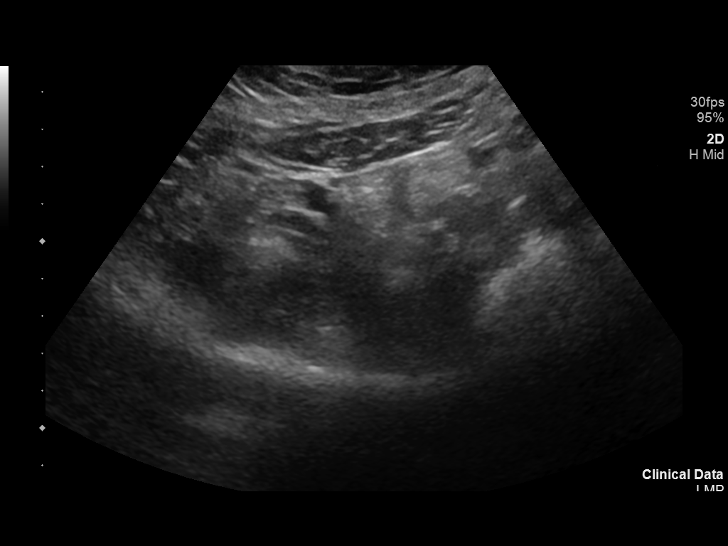
[im 23/62]
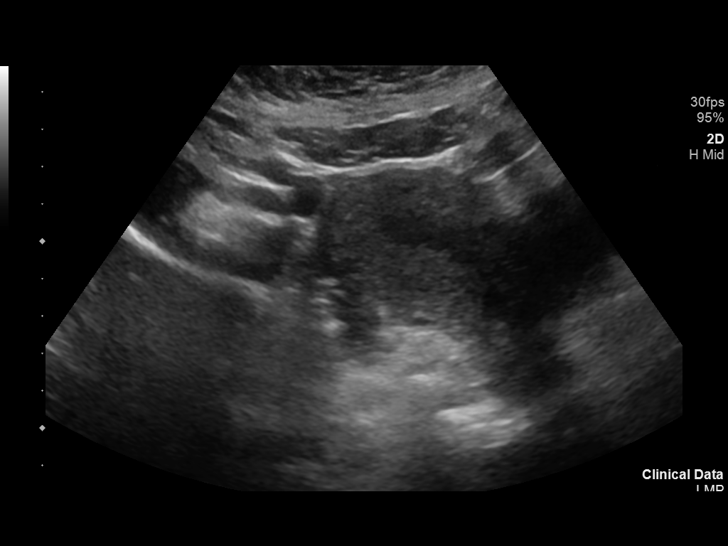
[im 28/62]
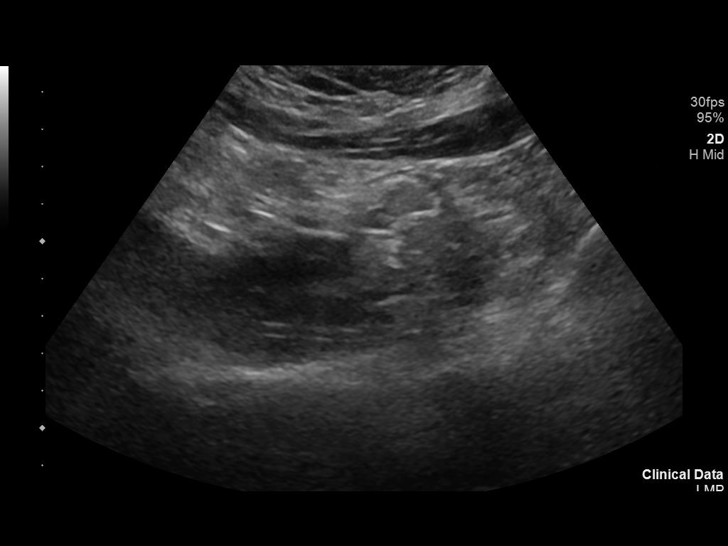
[im 34/62]
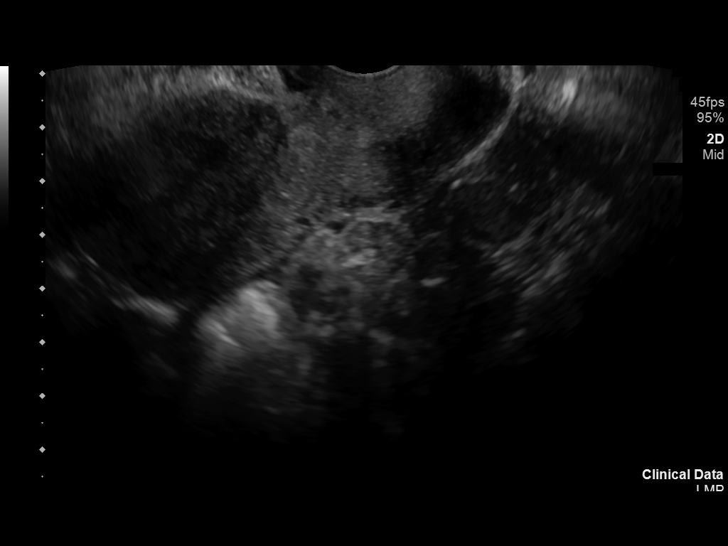
[im 39/62]
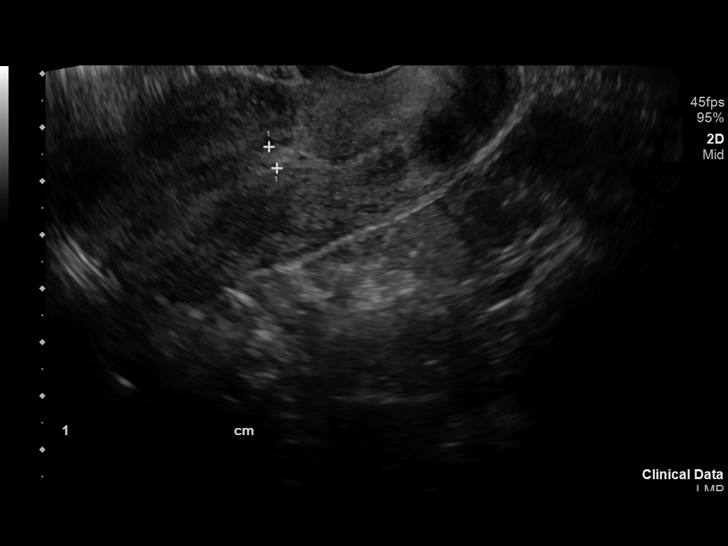
[im 41/62]
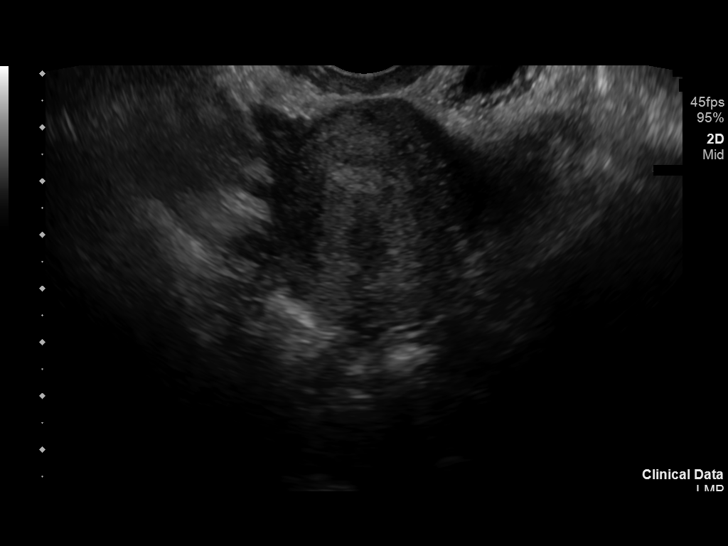
[im 46/62]
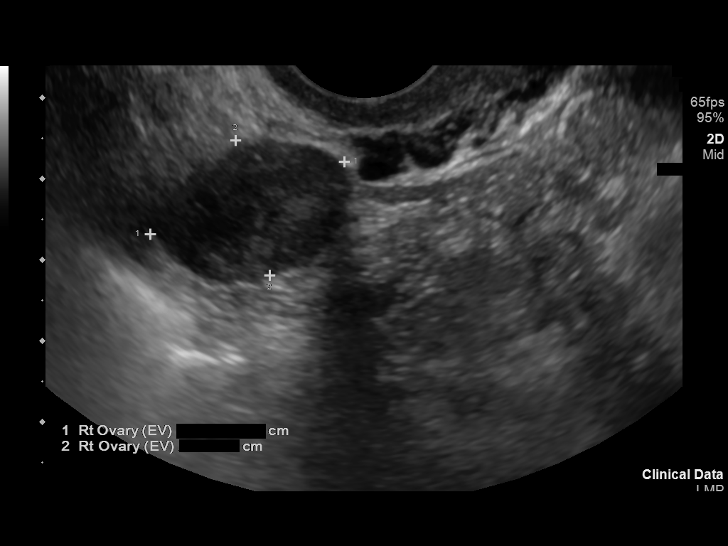
[im 51/62]
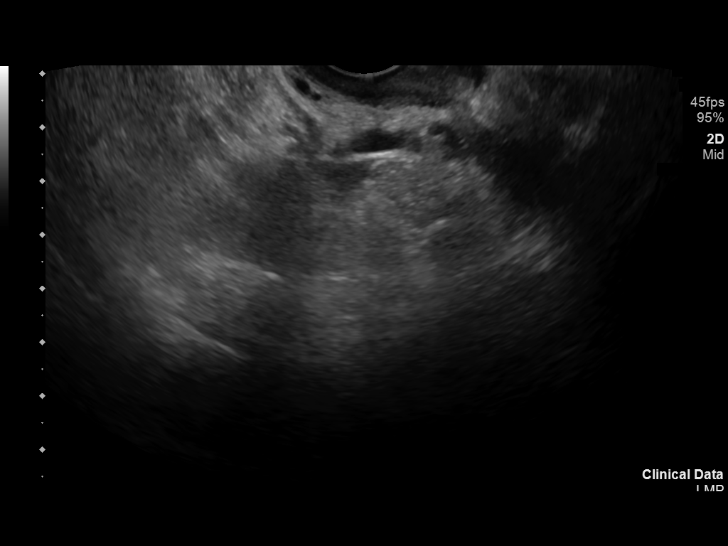
[im 56/62]
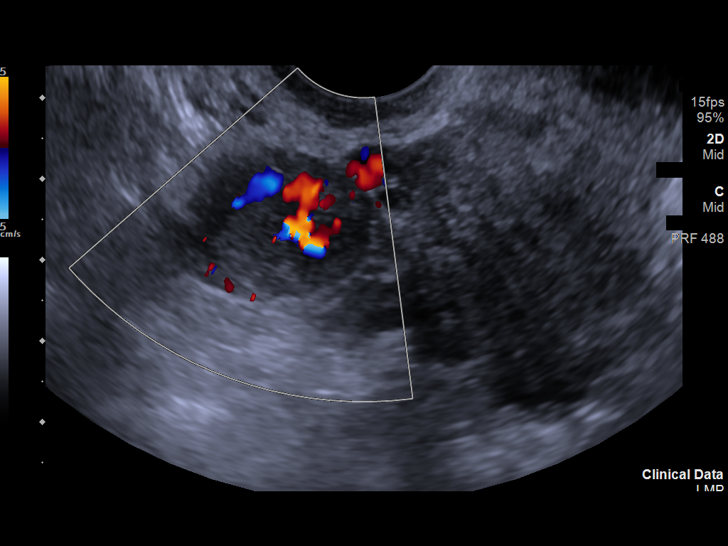
[im 62/62]
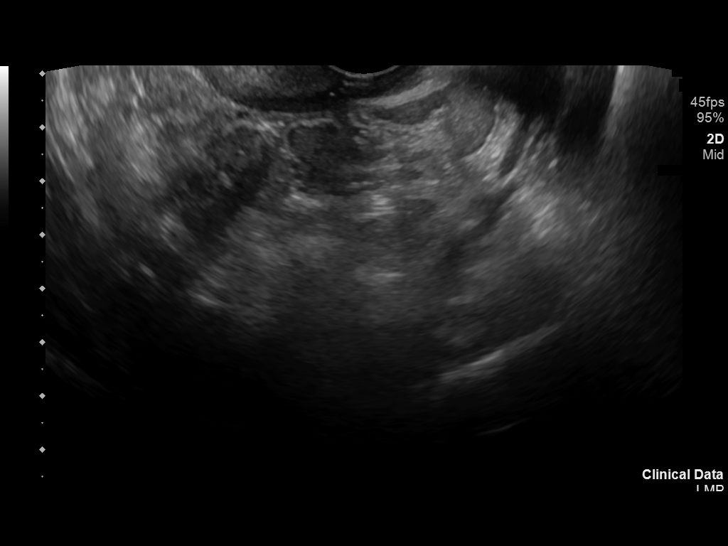

[14 of 25 positions shown; findings below may reference images not displayed]

FINDINGS: Uterus

Measurements: 8.9 x 3.9 x 4.2 cm = volume: 76 mL. No fibroids or
other mass visualized.

Endometrium

Thickness: 4 mm.  No focal abnormality visualized.

Right ovary

Measurements: 2.6 x 1.7 x 2.3 cm = volume: 5.2 mL. Normal
appearance/no adnexal mass.

Left ovary

Not visualized.

Other findings

No abnormal free fluid.
IMPRESSION: 1. No acute abnormality.
2. Nonvisualization of the left ovary.

## 2021-05-24 ENCOUNTER — Other Ambulatory Visit: Payer: Self-pay

## 2021-05-24 ENCOUNTER — Encounter: Payer: Self-pay | Admitting: Emergency Medicine

## 2021-05-24 ENCOUNTER — Ambulatory Visit
Admission: EM | Admit: 2021-05-24 | Discharge: 2021-05-24 | Disposition: A | Discharge status: Home / Self Care | Payer: Medicaid Other | Attending: Physician Assistant | Admitting: Physician Assistant

## 2021-05-24 DIAGNOSIS — H9201 Otalgia, right ear: Secondary | ICD-10-CM

## 2021-05-24 DIAGNOSIS — J029 Acute pharyngitis, unspecified: Secondary | ICD-10-CM

## 2021-05-24 MED ORDER — AMOXICILLIN-POT CLAVULANATE 875-125 MG PO TABS: 1.0000 | ORAL_TABLET | Freq: Two times a day (BID) | ORAL | 0 refills | Status: DC

## 2021-05-24 NOTE — ED Triage Notes (Signed)
Reports right tonsil hurting radiating with sharp pain into right ear

## 2021-05-24 NOTE — ED Provider Notes (Signed)
[name]    CSN: [id number] Arrival date & time: 05/24/21  1732      History   Chief Complaint Chief Complaint  Patient presents with   Sore Throat    HPI Barbara Zimmerman is a 30 y.o. female.   Patient here today for evaluation of right tonsil hurting that radiates into her ear.  She has not had any fever.  She denies any significant cough.  She states that she is typically will have issues with her right tonsil.  Per patient, she was supposed to have her tonsils removed as a child but did not have surgery as recommended.  The history is provided by the patient.  Sore Throat Pertinent negatives include no abdominal pain and no shortness of breath.   Past Medical History:  Diagnosis Date   Asthma    childhood   Post partum depression    Tonsillitis     There are no problems to display for this patient.   Past Surgical History:  Procedure Laterality Date   ABDOMINAL SURGERY     c secion     CESAREAN SECTION      OB History     Gravida  3   Para  1   Term      Preterm  1   AB  2   Living  2      SAB  1   IAB  1   Ectopic      Multiple  1   Live Births  2            Home Medications    Prior to Admission medications   Medication Sig Start Date End Date Taking? Authorizing Provider  amoxicillin-clavulanate (AUGMENTIN) 875-125 MG tablet Take 1 tablet by mouth every 12 (twelve) hours. 05/24/21  Yes Tomi Bamberger, PA-C  fluconazole (DIFLUCAN) 200 MG tablet Take 1 tablet (200 mg total) by mouth every 3 (three) days. 08/19/20   Brock Bad, MD  levonorgestrel-ethinyl estradiol (NORDETTE) 0.15-30 MG-MCG tablet Take 1 tablet by mouth daily. 08/19/20   Brock Bad, MD  metroNIDAZOLE (METROGEL VAGINAL) 0.75 % vaginal gel Place 1 Applicatorful vaginally 2 (two) times daily. 08/19/20   Brock Bad, MD    Family History Family History  Problem Relation Age of Onset   Diabetes Mother     Social History Social  History   Tobacco Use   Smoking status: Every Day    Packs/day: 0.50    Years: 5.00    Pack years: 2.50    Types: Cigarettes   Smokeless tobacco: Never  Vaping Use   Vaping Use: Never used  Substance Use Topics   Alcohol use: Not Currently   Drug use: No     Allergies   Flagyl [metronidazole] and Oysters [shellfish allergy]   Review of Systems Review of Systems  Constitutional:  Negative for chills and fever.  HENT:  Positive for ear pain and sore throat. Negative for congestion.   Eyes:  Negative for discharge and redness.  Respiratory:  Negative for cough and shortness of breath.   Gastrointestinal:  Negative for abdominal pain, nausea and vomiting.  Genitourinary:  Positive for vaginal bleeding and vaginal discharge.    Physical Exam Triage Vital Signs ED Triage Vitals  Enc Vitals Group     BP 05/24/21 1847 (!) 142/82     Pulse Rate 05/24/21 1847 77     Resp 05/24/21 1847 16     Temp  05/24/21 1847 97.9 F (36.6 C)     Temp Source 05/24/21 1847 Oral     SpO2 05/24/21 1847 98 %     Weight --      Height --      Head Circumference --      Peak Flow --      Pain Score 05/24/21 1848 8     Pain Loc --      Pain Edu? --      Excl. in GC? --    No data found.  Updated Vital Signs BP (!) 142/82 (BP Location: Right Arm)    Pulse 77    Temp 97.9 F (36.6 C) (Oral)    Resp 16    SpO2 98%      Physical Exam Vitals and nursing note reviewed.  Constitutional:      General: She is not in acute distress.    Appearance: Normal appearance. She is not ill-appearing.  HENT:     Head: Normocephalic and atraumatic.     Right Ear: Tympanic membrane normal.     Left Ear: Tympanic membrane normal.     Nose: Nose normal. No congestion or rhinorrhea.     Mouth/Throat:     Mouth: Mucous membranes are moist.     Pharynx: Posterior oropharyngeal erythema present.  Eyes:     Conjunctiva/sclera: Conjunctivae normal.  Cardiovascular:     Rate and Rhythm: Normal rate.   Pulmonary:     Effort: Pulmonary effort is normal.  Neurological:     Mental Status: She is alert.  Psychiatric:        Mood and Affect: Mood normal.        Behavior: Behavior normal.        Thought Content: Thought content normal.     UC Treatments / Results  Labs (all labs ordered are listed, but only abnormal results are displayed) Labs Reviewed - No data to display  EKG   Radiology No results found.  Procedures Procedures (including critical care time)  Medications Ordered in UC Medications - No data to display  Initial Impression / Assessment and Plan / UC Course  I have reviewed the triage vital signs and the nursing notes.  Pertinent labs & imaging results that were available during my care of the patient were reviewed by me and considered in my medical decision making (see chart for details).    Given unilateral throat pain as well as developing ear pain we will treat with antibiotic to hopefully clear symptoms.  Recommended follow-up if symptoms fail to improve with treatment or if they worsen anyway.  Final Clinical Impressions(s) / UC Diagnoses   Final diagnoses:  Acute pharyngitis, unspecified etiology  Acute otalgia, right   Discharge Instructions   None    ED Prescriptions     Medication Sig Dispense Auth. Provider   amoxicillin-clavulanate (AUGMENTIN) 875-125 MG tablet Take 1 tablet by mouth every 12 (twelve) hours. 14 tablet Tomi Bamberger, PA-C      PDMP not reviewed this encounter.   Tomi Bamberger, PA-C 05/24/21 1952

## 2021-08-10 ENCOUNTER — Ambulatory Visit
Admission: EM | Admit: 2021-08-10 | Discharge: 2021-08-10 | Disposition: A | Discharge status: Home / Self Care | Payer: Medicaid Other

## 2021-08-10 ENCOUNTER — Encounter: Payer: Self-pay | Admitting: Emergency Medicine

## 2021-08-10 ENCOUNTER — Other Ambulatory Visit: Payer: Self-pay

## 2021-08-10 DIAGNOSIS — J069 Acute upper respiratory infection, unspecified: Secondary | ICD-10-CM

## 2021-08-10 MED ORDER — BENZONATATE 100 MG PO CAPS: 100.0000 mg | ORAL_CAPSULE | Freq: Three times a day (TID) | ORAL | 0 refills | Status: DC | PRN

## 2021-08-10 MED ORDER — PROMETHAZINE-DM 6.25-15 MG/5ML PO SYRP: 5.0000 mL | ORAL_SOLUTION | Freq: Every day | ORAL | 0 refills | Status: DC

## 2021-08-10 NOTE — Discharge Instructions (Signed)
It appears that you have a viral upper respiratory infection that should self resolve in the next few days with symptomatic treatment.  You have been prescribed 2 medications to help alleviate cough.  Benzonatate is to be taken during the day.  Promethazine DM is to be taken at night because it can cause drowsiness.  Follow-up if symptoms persist or worsen. ?

## 2021-08-10 NOTE — ED Provider Notes (Signed)
?[name] ? ? ? ?CSN: [id number] ?Arrival date & time: 08/10/21  4270 ? ? ?  ? ?History   ?Chief Complaint ?Chief Complaint  ?Patient presents with  ? Cough  ? ? ?HPI ?Barbara Zimmerman is a 31 y.o. female.  ? ?Patient presents with harsh cough and nasal congestion that has been present for approximately 6 to 7 days.  Denies any associated fevers.  Her children have similar symptoms currently.  Patient does report that she has a history of asthma but denies any associated shortness of breath or wheezing.  She does report cold chills but denies any fever.  Denies chest pain, sore throat, ear pain, nausea, vomiting, diarrhea, abdominal pain.  Patient has taken Mucinex for symptoms with minimal improvement. ? ? ?Cough ? ?Past Medical History:  ?Diagnosis Date  ? Asthma   ? childhood  ? Post partum depression   ? Tonsillitis   ? ? ?There are no problems to display for this patient. ? ? ?Past Surgical History:  ?Procedure Laterality Date  ? ABDOMINAL SURGERY    ? c secion    ? CESAREAN SECTION    ? ? ?OB History   ? ? Gravida  ?3  ? Para  ?1  ? Term  ?   ? Preterm  ?1  ? AB  ?2  ? Living  ?2  ?  ? ? SAB  ?1  ? IAB  ?1  ? Ectopic  ?   ? Multiple  ?1  ? Live Births  ?2  ?   ?  ?  ? ? ? ?Home Medications   ? ?Prior to Admission medications   ?Medication Sig Start Date End Date Taking? Authorizing Provider  ?benzonatate (TESSALON) 100 MG capsule Take 1 capsule (100 mg total) by mouth every 8 (eight) hours as needed for cough. Take during the day 08/10/21  Yes Gustavus Bryant, FNP  ?promethazine-dextromethorphan (PROMETHAZINE-DM) 6.25-15 MG/5ML syrup Take 5 mLs by mouth at bedtime. 08/10/21  Yes Ervin Knack E, FNP  ?amoxicillin-clavulanate (AUGMENTIN) 875-125 MG tablet Take 1 tablet by mouth every 12 (twelve) hours. ?Patient not taking: Reported on 08/10/2021 05/24/21   Tomi Bamberger, PA-C  ?fluconazole (DIFLUCAN) 200 MG tablet Take 1 tablet (200 mg total) by mouth every 3 (three) days. ?Patient not taking:  Reported on 08/10/2021 08/19/20   Brock Bad, MD  ?levonorgestrel-ethinyl estradiol (NORDETTE) 0.15-30 MG-MCG tablet Take 1 tablet by mouth daily. ?Patient not taking: Reported on 08/10/2021 08/19/20   Brock Bad, MD  ?metroNIDAZOLE (METROGEL VAGINAL) 0.75 % vaginal gel Place 1 Applicatorful vaginally 2 (two) times daily. ?Patient not taking: Reported on 08/10/2021 08/19/20   Brock Bad, MD  ?Samuel Jester 0.18/0.215/0.25 MG-35 MCG tablet Take 1 tablet by mouth daily. 06/28/21   [provider]  ? ? ?Family History ?Family History  ?Problem Relation Age of Onset  ? Diabetes Mother   ? ? ?Social History ?Social History  ? ?Tobacco Use  ? Smoking status: Every Day  ?  Packs/day: 0.50  ?  Years: 5.00  ?  Pack years: 2.50  ?  Types: Cigarettes  ? Smokeless tobacco: Never  ?Vaping Use  ? Vaping Use: Never used  ?Substance Use Topics  ? Alcohol use: Not Currently  ? Drug use: No  ? ? ? ?Allergies   ?Flagyl [metronidazole] and Oysters [shellfish allergy] ? ? ?Review of Systems ?Review of Systems ?Per HPI ? ?Physical Exam ?Triage Vital Signs ?ED Triage Vitals  ?  Enc Vitals Group  ?   BP 08/10/21 1028 114/73  ?   Pulse Rate 08/10/21 1028 84  ?   Resp 08/10/21 1028 18  ?   Temp 08/10/21 1028 98.1 ?F (36.7 ?C)  ?   Temp Source 08/10/21 1028 Oral  ?   SpO2 08/10/21 1028 98 %  ?   Weight --   ?   Height --   ?   Head Circumference --   ?   Peak Flow --   ?   Pain Score 08/10/21 1024 9  ?   Pain Loc --   ?   Pain Edu? --   ?   Excl. in GC? --   ? ?No data found. ? ?Updated Vital Signs ?BP 114/73 (BP Location: Left Arm)   Pulse 84   Temp 98.1 ?F (36.7 ?C) (Oral)   Resp 18   LMP 07/23/2021 (Approximate)   SpO2 98%  ? ?Visual Acuity ?Right Eye Distance:   ?Left Eye Distance:   ?Bilateral Distance:   ? ?Right Eye Near:   ?Left Eye Near:    ?Bilateral Near:    ? ?Physical Exam ?Constitutional:   ?   General: She is not in acute distress. ?   Appearance: Normal appearance. She is not toxic-appearing or  diaphoretic.  ?HENT:  ?   Head: Normocephalic and atraumatic.  ?   Right Ear: Tympanic membrane and ear canal normal.  ?   Left Ear: Tympanic membrane and ear canal normal.  ?   Nose: Congestion present.  ?   Mouth/Throat:  ?   Mouth: Mucous membranes are moist.  ?   Pharynx: No posterior oropharyngeal erythema.  ?Eyes:  ?   Extraocular Movements: Extraocular movements intact.  ?   Conjunctiva/sclera: Conjunctivae normal.  ?   Pupils: Pupils are equal, round, and reactive to light.  ?Cardiovascular:  ?   Rate and Rhythm: Normal rate and regular rhythm.  ?   Pulses: Normal pulses.  ?   Heart sounds: Normal heart sounds.  ?Pulmonary:  ?   Effort: Pulmonary effort is normal. No respiratory distress.  ?   Breath sounds: Normal breath sounds. No stridor. No wheezing, rhonchi or rales.  ?Abdominal:  ?   General: Abdomen is flat. Bowel sounds are normal.  ?   Palpations: Abdomen is soft.  ?Musculoskeletal:     ?   General: Normal range of motion.  ?   Cervical back: Normal range of motion.  ?Skin: ?   General: Skin is warm and dry.  ?Neurological:  ?   General: No focal deficit present.  ?   Mental Status: She is alert and oriented to person, place, and time. Mental status is at baseline.  ?Psychiatric:     ?   Mood and Affect: Mood normal.     ?   Behavior: Behavior normal.  ? ? ? ?UC Treatments / Results  ?Labs ?(all labs ordered are listed, but only abnormal results are displayed) ?Labs Reviewed  ?COVID-19, FLU A+B NAA  ? ? ?EKG ? ? ?Radiology ?No results found. ? ?Procedures ?Procedures (including critical care time) ? ?Medications Ordered in UC ?Medications - No data to display ? ?Initial Impression / Assessment and Plan / UC Course  ?I have reviewed the triage vital signs and the nursing notes. ? ?Pertinent labs & imaging results that were available during my care of the patient were reviewed by me and considered in my medical decision making (see chart for  details). ? ?  ?Patient presents with symptoms likely from a  viral upper respiratory infection. Differential includes bacterial pneumonia, sinusitis, allergic rhinitis, COVID-19, flu. Do not suspect underlying cardiopulmonary process. Symptoms seem unlikely related to ACS, CHF or COPD exacerbations, pneumonia, pneumothorax. Patient is nontoxic appearing and not in need of emergent medical intervention.  COVID and flu test pending. ? ?Recommended symptom control with over the counter medications: Daily oral anti-histamine, Oral decongestant or IN corticosteroid, saline irrigations, cepacol lozenges, Robitussin, Delsym, honey tea.  Patient sent prescriptions and advised that Promethazine DM can cause drowsiness. ? ?Return if symptoms fail to improve in 1-2 weeks or you develop shortness of breath, chest pain, severe headache. Patient states understanding and is agreeable. ? ?Discharged with PCP followup.  ? ?Final Clinical Impressions(s) / UC Diagnoses  ? ?Final diagnoses:  ?Viral upper respiratory tract infection with cough  ? ? ? ?Discharge Instructions   ? ?  ?It appears that you have a viral upper respiratory infection that should self resolve in the next few days with symptomatic treatment.  You have been prescribed 2 medications to help alleviate cough.  Benzonatate is to be taken during the day.  Promethazine DM is to be taken at night because it can cause drowsiness.  Follow-up if symptoms persist or worsen. ? ? ? ?ED Prescriptions   ? ? Medication Sig Dispense Auth. Provider  ? benzonatate (TESSALON) 100 MG capsule Take 1 capsule (100 mg total) by mouth every 8 (eight) hours as needed for cough. Take during the day 21 capsule [place], Acie FredricksonHaley E, [place]NP  ? promethazine-dextromethorphan (PROMETHAZINE-DM) 6.25-15 MG/5ML syrup Take 5 mLs by mouth at bedtime. 118 mL Gustavus BryantMound, Driana Dazey E, [place]FNP  ? ?  ? ?PDMP not reviewed this encounter. ?  ?Gustavus BryantMound, Asusena Sigley E, [place]FNP ?08/10/21 1052 ? ?

## 2021-08-10 NOTE — ED Triage Notes (Signed)
Symptoms started, 3/10 started feeling week.  Complains of weakness, cough, chest soreness with coughing/burning.  Having cold chills ?

## 2021-08-11 LAB — COVID-19, FLU A+B NAA
Influenza A, NAA: NOT DETECTED
Influenza B, NAA: NOT DETECTED
SARS-CoV-2, NAA: NOT DETECTED

## 2021-09-07 ENCOUNTER — Ambulatory Visit
Admission: EM | Admit: 2021-09-07 | Discharge: 2021-09-07 | Disposition: A | Discharge status: Home / Self Care | Payer: Medicaid Other | Attending: Internal Medicine | Admitting: Internal Medicine

## 2021-09-07 DIAGNOSIS — R1030 Lower abdominal pain, unspecified: Secondary | ICD-10-CM

## 2021-09-07 DIAGNOSIS — Z113 Encounter for screening for infections with a predominantly sexual mode of transmission: Secondary | ICD-10-CM | POA: Insufficient documentation

## 2021-09-07 DIAGNOSIS — R8290 Unspecified abnormal findings in urine: Secondary | ICD-10-CM | POA: Insufficient documentation

## 2021-09-07 LAB — POCT URINE PREGNANCY: Preg Test, Ur: NEGATIVE

## 2021-09-07 LAB — POCT URINALYSIS DIP (MANUAL ENTRY)
Bilirubin, UA: NEGATIVE
Blood, UA: NEGATIVE
Glucose, UA: NEGATIVE mg/dL
Leukocytes, UA: NEGATIVE
Nitrite, UA: NEGATIVE
Protein Ur, POC: 30 mg/dL — AB
Spec Grav, UA: 1.02 (ref 1.010–1.025)
Urobilinogen, UA: 0.2 E.U./dL
pH, UA: 6.5 (ref 5.0–8.0)

## 2021-09-07 LAB — POCT FASTING CBG KUC MANUAL ENTRY: POCT Glucose (KUC): 94 mg/dL (ref 70–99)

## 2021-09-07 NOTE — ED Provider Notes (Signed)
?[name] ? ? ? ?CSN: [id number] ?Arrival date & time: 09/07/21  1741 ? ? ?  ? ?History   ?Chief Complaint ?Chief Complaint  ?Patient presents with  ? Dysuria  ? ? ?HPI ?Barbara Zimmerman is a 31 y.o. female.  ? ?Patient presents with malodorous urine and lower abdominal pain that has been intermittent over the past 2 weeks.  Denies urinary burning, urinary frequency, urinary urgency, irregular vaginal bleeding, vaginal discharge, back pain, fever.  Denies any known exposure to STD and reports latest unprotected sexual intercourse was 2 months ago.  Patient is requesting vaginal swab for STD testing as well as blood work for HIV and syphilis.  Patient denies nausea, vomiting, diarrhea, constipation.  Having regular bowel movements and no blood in stool. ? ? ?Dysuria ? ?Past Medical History:  ?Diagnosis Date  ? Asthma   ? childhood  ? Post partum depression   ? Tonsillitis   ? ? ?There are no problems to display for this patient. ? ? ?Past Surgical History:  ?Procedure Laterality Date  ? ABDOMINAL SURGERY    ? c secion    ? CESAREAN SECTION    ? ? ?OB History   ? ? Gravida  ?3  ? Para  ?1  ? Term  ?   ? Preterm  ?1  ? AB  ?2  ? Living  ?2  ?  ? ? SAB  ?1  ? IAB  ?1  ? Ectopic  ?   ? Multiple  ?1  ? Live Births  ?2  ?   ?  ?  ? ? ? ?Home Medications   ? ?Prior to Admission medications   ?Medication Sig Start Date End Date Taking? Authorizing Provider  ?amoxicillin-clavulanate (AUGMENTIN) 875-125 MG tablet Take 1 tablet by mouth every 12 (twelve) hours. ?Patient not taking: Reported on 08/10/2021 05/24/21   Tomi Bamberger, PA-C  ?benzonatate (TESSALON) 100 MG capsule Take 1 capsule (100 mg total) by mouth every 8 (eight) hours as needed for cough. Take during the day 08/10/21   Gustavus Bryant, FNP  ?fluconazole (DIFLUCAN) 200 MG tablet Take 1 tablet (200 mg total) by mouth every 3 (three) days. ?Patient not taking: Reported on 08/10/2021 08/19/20   Brock Bad, MD  ?levonorgestrel-ethinyl estradiol  (NORDETTE) 0.15-30 MG-MCG tablet Take 1 tablet by mouth daily. ?Patient not taking: Reported on 08/10/2021 08/19/20   Brock Bad, MD  ?metroNIDAZOLE (METROGEL VAGINAL) 0.75 % vaginal gel Place 1 Applicatorful vaginally 2 (two) times daily. ?Patient not taking: Reported on 08/10/2021 08/19/20   Brock Bad, MD  ?promethazine-dextromethorphan (PROMETHAZINE-DM) 6.25-15 MG/5ML syrup Take 5 mLs by mouth at bedtime. 08/10/21   Gustavus Bryant, FNP  ?TRI-ESTARYLLA 0.18/0.215/0.25 MG-35 MCG tablet Take 1 tablet by mouth daily. 06/28/21   [provider]  ? ? ?Family History ?Family History  ?Problem Relation Age of Onset  ? Diabetes Mother   ? ? ?Social History ?Social History  ? ?Tobacco Use  ? Smoking status: Some Days  ?  Packs/day: 0.50  ?  Years: 5.00  ?  Pack years: 2.50  ?  Types: Cigarettes  ? Smokeless tobacco: Never  ?Vaping Use  ? Vaping Use: Never used  ?Substance Use Topics  ? Alcohol use: Not Currently  ? Drug use: No  ? ? ? ?Allergies   ?Flagyl [metronidazole] and Oysters [shellfish allergy] ? ? ?Review of Systems ?Review of Systems ?Per HPI ? ?Physical Exam ?Triage Vital Signs ?ED  Triage Vitals  ?Enc Vitals Group  ?   BP 09/07/21 1747 104/73  ?   Pulse Rate 09/07/21 1756 72  ?   Resp 09/07/21 1747 18  ?   Temp 09/07/21 1747 98.1 ?F (36.7 ?C)  ?   Temp Source 09/07/21 1747 Oral  ?   SpO2 09/07/21 1747 97 %  ?   Weight 09/07/21 1745 225 lb (102.1 kg)  ?   Height 09/07/21 1745 5\' 6"  (1.676 m)  ?   Head Circumference --   ?   Peak Flow --   ?   Pain Score 09/07/21 1745 5  ?   Pain Loc --   ?   Pain Edu? --   ?   Excl. in GC? --   ? ?No data found. ? ?Updated Vital Signs ?BP 104/73 (BP Location: Right Arm)   Pulse 72   Temp 98.1 ?F (36.7 ?C) (Oral)   Resp 18   Ht 5\' 6"  (1.676 m)   Wt 225 lb (102.1 kg)   SpO2 97%   BMI 36.32 kg/m?  ? ?Visual Acuity ?Right Eye Distance:   ?Left Eye Distance:   ?Bilateral Distance:   ? ?Right Eye Near:   ?Left Eye Near:    ?Bilateral Near:    ? ?Physical  Exam ?Constitutional:   ?   General: She is not in acute distress. ?   Appearance: Normal appearance. She is not toxic-appearing or diaphoretic.  ?HENT:  ?   Head: Normocephalic and atraumatic.  ?Eyes:  ?   Extraocular Movements: Extraocular movements intact.  ?   Conjunctiva/sclera: Conjunctivae normal.  ?Cardiovascular:  ?   Rate and Rhythm: Normal rate and regular rhythm.  ?   Pulses: Normal pulses.  ?   Heart sounds: Normal heart sounds.  ?Pulmonary:  ?   Effort: Pulmonary effort is normal. No respiratory distress.  ?   Breath sounds: Normal breath sounds.  ?Abdominal:  ?   General: Bowel sounds are normal. There is no distension.  ?   Palpations: Abdomen is soft.  ?   Tenderness: There is no abdominal tenderness.  ?Genitourinary: ?   Comments: Deferred with shared decision making.  Self swab performed. ?Neurological:  ?   General: No focal deficit present.  ?   Mental Status: She is alert and oriented to person, place, and time. Mental status is at baseline.  ?Psychiatric:     ?   Mood and Affect: Mood normal.     ?   Behavior: Behavior normal.     ?   Thought Content: Thought content normal.     ?   Judgment: Judgment normal.  ? ? ? ?UC Treatments / Results  ?Labs ?(all labs ordered are listed, but only abnormal results are displayed) ?Labs Reviewed  ?POCT URINALYSIS DIP (MANUAL ENTRY) - Abnormal; Notable for the following components:  ?    Result Value  ? Ketones, POC UA moderate (40) (*)   ? Protein Ur, POC =30 (*)   ? All other components within normal limits  ?URINE CULTURE  ?HIV ANTIBODY (ROUTINE TESTING W REFLEX)  ?RPR  ?POCT URINE PREGNANCY  ?POCT FASTING [facility] MANUAL ENTRY  ?CERVICOVAGINAL ANCILLARY ONLY  ? ? ?EKG ? ? ?Radiology ?No results found. ? ?Procedures ?Procedures (including critical care time) ? ?Medications Ordered in UC ?Medications - No data to display ? ?Initial Impression / Assessment and Plan / UC Course  ?I have reviewed the triage vital signs and the nursing  notes. ? ?Pertinent labs  & imaging results that were available during my care of the patient were reviewed by me and considered in my medical decision making (see chart for details). ? ?  ? ?Urinalysis not indicating urinary tract infection.  It did show moderate amount of ketones but blood sugar was normal at 94.  Urine culture is pending.  Cervicovaginal swab pending to rule out vaginitis as cause of patient's symptoms.  Low suspicion for ovarian torsion or appendicitis.  Although, patient was given strict ER precautions and advised to go the hospital if symptoms persist or worsen.  Will await urine culture and cervicovaginal swab for any further treatment at this time.  Patient to refrain from sexual activity until test results and treatment are complete.  Do not think the patient is in need of immediate medical attention at the hospital at this time given physical exam and vital signs.  Patient verbalized understanding and was agreeable with plan. ?Final Clinical Impressions(s) / UC Diagnoses  ? ?Final diagnoses:  ?Malodorous urine  ?Lower abdominal pain  ?Screening examination for venereal disease  ? ? ? ?Discharge Instructions   ? ?  ?Urine is not showing signs of urinary tract infection.  Urine culture and vaginal swab are pending.  We will call if they are positive and treat as appropriate.  Please refrain from sexual activity until test results and treatment are complete.  Go to the hospital if symptoms persist or worsen. ? ? ? ?ED Prescriptions   ?None ?  ? ?PDMP not reviewed this encounter. ?  ?Gustavus Bryant, [place] ?09/07/21 1839 ? ?

## 2021-09-07 NOTE — ED Triage Notes (Signed)
Patient presents to Urgent Care with complaints of strong urine smell with lower abd pain since 2 weeks ago. Patient reports not being sexually active for 2 months but would like sti treatment today.   ?

## 2021-09-07 NOTE — Discharge Instructions (Signed)
Urine is not showing signs of urinary tract infection.  Urine culture and vaginal swab are pending.  We will call if they are positive and treat as appropriate.  Please refrain from sexual activity until test results and treatment are complete.  Go to the hospital if symptoms persist or worsen. ?

## 2021-09-08 LAB — CERVICOVAGINAL ANCILLARY ONLY
Bacterial Vaginitis (gardnerella): NEGATIVE
Candida Glabrata: NEGATIVE
Candida Vaginitis: NEGATIVE
Chlamydia: NEGATIVE
Comment: NEGATIVE
Comment: NEGATIVE
Comment: NEGATIVE
Comment: NEGATIVE
Comment: NEGATIVE
Comment: NORMAL
Neisseria Gonorrhea: NEGATIVE
Trichomonas: NEGATIVE

## 2021-09-08 LAB — HIV ANTIBODY (ROUTINE TESTING W REFLEX): HIV Screen 4th Generation wRfx: NONREACTIVE

## 2021-09-08 LAB — RPR: RPR Ser Ql: NONREACTIVE

## 2021-09-09 LAB — URINE CULTURE: Culture: NO GROWTH

## 2021-10-21 ENCOUNTER — Encounter: Payer: Self-pay | Admitting: Emergency Medicine

## 2021-10-21 ENCOUNTER — Ambulatory Visit
Admission: EM | Admit: 2021-10-21 | Discharge: 2021-10-21 | Disposition: A | Discharge status: Home / Self Care | Payer: Medicaid Other | Attending: Family Medicine | Admitting: Family Medicine

## 2021-10-21 ENCOUNTER — Other Ambulatory Visit: Payer: Self-pay

## 2021-10-21 DIAGNOSIS — J029 Acute pharyngitis, unspecified: Secondary | ICD-10-CM | POA: Insufficient documentation

## 2021-10-21 DIAGNOSIS — J069 Acute upper respiratory infection, unspecified: Secondary | ICD-10-CM | POA: Diagnosis not present

## 2021-10-21 LAB — POCT RAPID STREP A (OFFICE): Rapid Strep A Screen: NEGATIVE

## 2021-10-21 MED ORDER — KETOROLAC TROMETHAMINE 30 MG/ML IJ SOLN
30.0000 mg | Freq: Once | INTRAMUSCULAR | Status: AC
  Administered 2021-10-21: 30 mg via INTRAMUSCULAR

## 2021-10-21 MED ORDER — BENZONATATE 100 MG PO CAPS: 100.0000 mg | ORAL_CAPSULE | Freq: Three times a day (TID) | ORAL | 0 refills | Status: DC | PRN

## 2021-10-21 MED ORDER — IBUPROFEN 800 MG PO TABS: 800.0000 mg | ORAL_TABLET | Freq: Three times a day (TID) | ORAL | 0 refills | Status: DC | PRN

## 2021-10-21 NOTE — Discharge Instructions (Addendum)
Your strep test is negative.  Culture of the throat will be sent, and staff will notify you if that is in turn positive.  Take ibuprofen 800 mg--1 tab every 8 hours as needed for pain.  Take benzonatate 100 mg, 1 tab every 8 hours as needed for cough.  You have been given a shot of Toradol 30 mg today.   You have been swabbed for COVID, and the test will result in the next 24 hours. Our staff will call you if positive. If the test is positive, you should quarantine for 5 days.

## 2021-10-21 NOTE — ED Triage Notes (Signed)
Pt here for cough and congestion with body aches x 3 days  

## 2021-10-21 NOTE — ED Provider Notes (Addendum)
[name]    CSN: [id number] Arrival date & time: 10/21/21  1238      History   Chief Complaint Chief Complaint  Patient presents with   Cough    HPI Barbara Zimmerman is a 31 y.o. female.    Cough Here for history of subjective fever and chills, very sore throat, cough and headache.  Her right ear is also hurting.  This began yesterday.  No vomiting or diarrhea.  She states she is feels very weak and her vision is blurry.  Past Medical History:  Diagnosis Date   Asthma    childhood   Post partum depression    Tonsillitis     There are no problems to display for this patient.   Past Surgical History:  Procedure Laterality Date   ABDOMINAL SURGERY     c secion     CESAREAN SECTION      OB History     Gravida  3   Para  1   Term      Preterm  1   AB  2   Living  2      SAB  1   IAB  1   Ectopic      Multiple  1   Live Births  2            Home Medications    Prior to Admission medications   Medication Sig Start Date End Date Taking? Authorizing Provider  ibuprofen (ADVIL) 800 MG tablet Take 1 tablet (800 mg total) by mouth every 8 (eight) hours as needed (pain). 10/21/21  Yes Cashtyn Pouliot, Janace Aris, MD  benzonatate (TESSALON) 100 MG capsule Take 1 capsule (100 mg total) by mouth every 8 (eight) hours as needed for cough. Take during the day 10/21/21   Zenia Resides, MD  levonorgestrel-ethinyl estradiol (NORDETTE) 0.15-30 MG-MCG tablet Take 1 tablet by mouth daily. Patient not taking: Reported on 08/10/2021 08/19/20   Brock Bad, MD  TRI-ESTARYLLA 0.18/0.215/0.25 MG-35 MCG tablet Take 1 tablet by mouth daily. 06/28/21   [provider]    Family History Family History  Problem Relation Age of Onset   Diabetes Mother     Social History Social History   Tobacco Use   Smoking status: Some Days    Packs/day: 0.50    Years: 5.00    Pack years: 2.50    Types: Cigarettes   Smokeless tobacco: Never  Vaping  Use   Vaping Use: Never used  Substance Use Topics   Alcohol use: Not Currently   Drug use: No     Allergies   Flagyl [metronidazole] and Oysters [shellfish allergy]   Review of Systems Review of Systems  Respiratory:  Positive for cough.     Physical Exam Triage Vital Signs ED Triage Vitals [10/21/21 1320]  Enc Vitals Group     BP 115/62     Pulse Rate 90     Resp 18     Temp 98.7 F (37.1 C)     Temp Source Oral     SpO2 98 %     Weight      Height      Head Circumference      Peak Flow      Pain Score 5     Pain Loc      Pain Edu?      Excl. in GC?    No data found.  Updated Vital Signs BP 115/62 (BP  Location: Right Arm)   Pulse 90   Temp 98.7 F (37.1 C) (Oral)   Resp 18   SpO2 98%   Visual Acuity Right Eye Distance:   Left Eye Distance:   Bilateral Distance:    Right Eye Near:   Left Eye Near:    Bilateral Near:     Physical Exam Vitals reviewed.  Constitutional:      General: She is not in acute distress.    Appearance: She is not toxic-appearing.  HENT:     Right Ear: Tympanic membrane and ear canal normal.     Left Ear: Tympanic membrane and ear canal normal.     Nose: Nose normal.     Mouth/Throat:     Mouth: Mucous membranes are moist.     Comments: There is some mild erythema of the tonsillar pillars and the posterior oropharynx and some clear mucus draining. Eyes:     Extraocular Movements: Extraocular movements intact.     Conjunctiva/sclera: Conjunctivae normal.     Pupils: Pupils are equal, round, and reactive to light.  Cardiovascular:     Rate and Rhythm: Normal rate and regular rhythm.     Heart sounds: No murmur heard. Pulmonary:     Effort: Pulmonary effort is normal. No respiratory distress.     Breath sounds: No wheezing, rhonchi or rales.  Musculoskeletal:     Cervical back: Neck supple.  Lymphadenopathy:     Cervical: No cervical adenopathy.  Skin:    Capillary Refill: Capillary refill takes less than 2  seconds.     Coloration: Skin is not jaundiced or pale.  Neurological:     General: No focal deficit present.     Mental Status: She is alert and oriented to person, place, and time.  Psychiatric:        Behavior: Behavior normal.     UC Treatments / Results  Labs (all labs ordered are listed, but only abnormal results are displayed) Labs Reviewed  CULTURE, GROUP A STREP ([facility])  COVID-19, FLU A+B NAA  POCT RAPID STREP A (OFFICE)    EKG   Radiology No results found.  Procedures Procedures (including critical care time)  Medications Ordered in UC Medications  ketorolac (TORADOL) 30 MG/ML injection 30 mg (has no administration in time range)    Initial Impression / Assessment and Plan / UC Course  I have reviewed the triage vital signs and the nursing notes.  Pertinent labs & imaging results that were available during my care of the patient were reviewed by me and considered in my medical decision making (see chart for details).   Rapid strep is negative, so we will send a culture,treat per protocol if positive  We will do a COVID swab so she knows if she needs to quarantine Final Clinical Impressions(s) / UC Diagnoses   Final diagnoses:  Sore throat  Viral URI with cough     Discharge Instructions      Your strep test is negative.  Culture of the throat will be sent, and staff will notify you if that is in turn positive.  Take ibuprofen 800 mg--1 tab every 8 hours as needed for pain.  Take benzonatate 100 mg, 1 tab every 8 hours as needed for cough.  You have been given a shot of Toradol 30 mg today.   You have been swabbed for COVID, and the test will result in the next 24 hours. Our staff will call you if positive. If the test is  positive, you should quarantine for 5 days.      ED Prescriptions     Medication Sig Dispense Auth. Provider   benzonatate (TESSALON) 100 MG capsule Take 1 capsule (100 mg total) by mouth every 8 (eight) hours as needed  for cough. Take during the day 21 capsule Mai Longnecker, Janace Aris, MD   ibuprofen (ADVIL) 800 MG tablet Take 1 tablet (800 mg total) by mouth every 8 (eight) hours as needed (pain). 21 tablet Fordyce Lepak, Janace Aris, MD      PDMP not reviewed this encounter.   Zenia Resides, MD 10/21/21 1357    Zenia Resides, MD 10/21/21 1359

## 2021-10-22 LAB — COVID-19, FLU A+B NAA
Influenza A, NAA: NOT DETECTED
Influenza B, NAA: NOT DETECTED
SARS-CoV-2, NAA: NOT DETECTED

## 2021-10-24 LAB — CULTURE, GROUP A STREP (THRC)

## 2021-12-29 ENCOUNTER — Ambulatory Visit
Admission: EM | Admit: 2021-12-29 | Discharge: 2021-12-29 | Disposition: A | Discharge status: Home / Self Care | Payer: Medicaid Other | Attending: Physician Assistant | Admitting: Physician Assistant

## 2021-12-29 DIAGNOSIS — N760 Acute vaginitis: Secondary | ICD-10-CM | POA: Diagnosis not present

## 2021-12-29 DIAGNOSIS — N3000 Acute cystitis without hematuria: Secondary | ICD-10-CM | POA: Diagnosis present

## 2021-12-29 LAB — POCT URINALYSIS DIP (MANUAL ENTRY)
Bilirubin, UA: NEGATIVE
Blood, UA: NEGATIVE
Glucose, UA: NEGATIVE mg/dL
Nitrite, UA: NEGATIVE
Spec Grav, UA: >=1.03 — AB (ref 1.010–1.025)
Urobilinogen, UA: 0.2 E.U./dL
pH, UA: 6 (ref 5.0–8.0)

## 2021-12-29 LAB — POCT URINE PREGNANCY: Preg Test, Ur: NEGATIVE

## 2021-12-29 MED ORDER — NITROFURANTOIN MONOHYD MACRO 100 MG PO CAPS: 100.0000 mg | ORAL_CAPSULE | Freq: Two times a day (BID) | ORAL | 0 refills | Status: DC

## 2021-12-29 NOTE — ED Triage Notes (Signed)
Pt c/o abd pain/cramps. Concerned for vaginal yeast or BV. Associated nausea. Onset ~ 1 week

## 2021-12-29 NOTE — ED Provider Notes (Signed)
[name]    CSN: [id number] Arrival date & time: 12/29/21  1544      History   Chief Complaint Chief Complaint  Patient presents with   vaginal concerns    HPI CHANYA CHRISLEY is a 31 y.o. female.   Patient here today for evaluation of lower abdominal pain, low back pain and vaginal irritation that is been ongoing for the last several days.  She reports that she used leftover BV treatment as well as yeast treatment without significant relief.  She notes she did have sex recently and her partner used a latex condom which she has had reactions to in the past.  She denies any dysuria.  She has not had any fever.  The history is provided by the patient.    Past Medical History:  Diagnosis Date   Asthma    childhood   Post partum depression    Tonsillitis     There are no problems to display for this patient.   Past Surgical History:  Procedure Laterality Date   ABDOMINAL SURGERY     c secion     CESAREAN SECTION      OB History     Gravida  3   Para  1   Term      Preterm  1   AB  2   Living  2      SAB  1   IAB  1   Ectopic      Multiple  1   Live Births  2            Home Medications    Prior to Admission medications   Medication Sig Start Date End Date Taking? Authorizing Provider  benzonatate (TESSALON) 100 MG capsule Take 1 capsule (100 mg total) by mouth every 8 (eight) hours as needed for cough. Take during the day 10/21/21   Zenia Resides, MD  ibuprofen (ADVIL) 800 MG tablet Take 1 tablet (800 mg total) by mouth every 8 (eight) hours as needed (pain). 10/21/21   Zenia Resides, MD  levonorgestrel-ethinyl estradiol (NORDETTE) 0.15-30 MG-MCG tablet Take 1 tablet by mouth daily. Patient not taking: Reported on 08/10/2021 08/19/20   Brock Bad, MD  TRI-ESTARYLLA 0.18/0.215/0.25 MG-35 MCG tablet Take 1 tablet by mouth daily. 06/28/21   [provider]    Family History Family History  Problem  Relation Age of Onset   Diabetes Mother     Social History Social History   Tobacco Use   Smoking status: Some Days    Packs/day: 0.50    Years: 5.00    Total pack years: 2.50    Types: Cigarettes   Smokeless tobacco: Never  Vaping Use   Vaping Use: Never used  Substance Use Topics   Alcohol use: Not Currently   Drug use: No     Allergies   Flagyl [metronidazole] and Oysters [shellfish allergy]   Review of Systems Review of Systems  Constitutional:  Negative for chills and fever.  Eyes:  Negative for discharge and redness.  Gastrointestinal:  Positive for abdominal pain. Negative for nausea and vomiting.  Genitourinary:  Positive for vaginal discharge. Negative for dysuria.  Musculoskeletal:  Positive for back pain.     Physical Exam Triage Vital Signs ED Triage Vitals [12/29/21 1617]  Enc Vitals Group     BP 120/80     Pulse Rate 80     Resp 18     Temp 98 F (  36.7 C)     Temp Source Oral     SpO2 97 %     Weight      Height      Head Circumference      Peak Flow      Pain Score 0     Pain Loc      Pain Edu?      Excl. in GC?    No data found.  Updated Vital Signs BP 120/80 (BP Location: Right Arm)   Pulse 80   Temp 98 F (36.7 C) (Oral)   Resp 18   SpO2 97%      Physical Exam Vitals and nursing note reviewed.  Constitutional:      General: She is not in acute distress.    Appearance: Normal appearance. She is not ill-appearing.  HENT:     Head: Normocephalic and atraumatic.  Eyes:     Conjunctiva/sclera: Conjunctivae normal.  Cardiovascular:     Rate and Rhythm: Normal rate and regular rhythm.     Heart sounds: Normal heart sounds. No murmur heard. Pulmonary:     Effort: Pulmonary effort is normal. No respiratory distress.     Breath sounds: Normal breath sounds. No wheezing or rales.  Abdominal:     General: Abdomen is flat. Bowel sounds are normal. There is no distension.     Palpations: Abdomen is soft.     Tenderness: There is  no abdominal tenderness. There is no guarding or rebound.  Neurological:     Mental Status: She is alert.  Psychiatric:        Mood and Affect: Mood normal.        Behavior: Behavior normal.        Thought Content: Thought content normal.      UC Treatments / Results  Labs (all labs ordered are listed, but only abnormal results are displayed) Labs Reviewed  POCT URINALYSIS DIP (MANUAL ENTRY) - Abnormal; Notable for the following components:      Result Value   Color, UA orange (*)    Ketones, POC UA small (15) (*)    Spec Grav, UA >=1.030 (*)    Protein Ur, POC trace (*)    Leukocytes, UA Small (1+) (*)    All other components within normal limits  POCT URINE PREGNANCY  CERVICOVAGINAL ANCILLARY ONLY    EKG   Radiology No results found.  Procedures Procedures (including critical care time)  Medications Ordered in UC Medications - No data to display  Initial Impression / Assessment and Plan / UC Course  I have reviewed the triage vital signs and the nursing notes.  Pertinent labs & imaging results that were available during my care of the patient were reviewed by me and considered in my medical decision making (see chart for details).    Screening ordered to rule out STD, BV or yeast as cause of symptoms.  UA with positive LE, will order culture and treatment for UTI.  Encouraged follow-up with any further concerns while awaiting results.  Final Clinical Impressions(s) / UC Diagnoses   Final diagnoses:  None   Discharge Instructions   None    ED Prescriptions   None    PDMP not reviewed this encounter.   Tomi Bamberger, PA-C 12/29/21 1718

## 2022-01-01 ENCOUNTER — Telehealth: Payer: Self-pay | Admitting: *Deleted

## 2022-01-01 LAB — CERVICOVAGINAL ANCILLARY ONLY
Bacterial Vaginitis (gardnerella): NEGATIVE
Candida Glabrata: NEGATIVE
Candida Vaginitis: NEGATIVE
Chlamydia: NEGATIVE
Comment: NEGATIVE
Comment: NEGATIVE
Comment: NEGATIVE
Comment: NEGATIVE
Comment: NEGATIVE
Comment: NORMAL
Neisseria Gonorrhea: NEGATIVE
Trichomonas: NEGATIVE

## 2022-01-01 NOTE — Telephone Encounter (Signed)
Pt requesting lab results; states she stopped taking the abx yesterday after only a few days and feels it may have only improved sxs minimally. Pt informed of cytology results being negative for STIs, yeast, and BV. Encouraged pt to continue taking the abx for the UTI until the course is completed to ensure the infection is resolved. Informed pt she should have re-eval if she completes the course and yet continues with sxs, or if she develops any worsening or new sxs.  Pt verbalized understanding of above instructions.

## 2022-01-08 ENCOUNTER — Other Ambulatory Visit (HOSPITAL_COMMUNITY): Payer: Self-pay

## 2022-02-12 ENCOUNTER — Ambulatory Visit
Admission: EM | Admit: 2022-02-12 | Discharge: 2022-02-12 | Disposition: A | Discharge status: Home / Self Care | Payer: Medicaid Other | Attending: Internal Medicine | Admitting: Internal Medicine

## 2022-02-12 DIAGNOSIS — J029 Acute pharyngitis, unspecified: Secondary | ICD-10-CM

## 2022-02-12 DIAGNOSIS — H9203 Otalgia, bilateral: Secondary | ICD-10-CM | POA: Diagnosis present

## 2022-02-12 LAB — POCT RAPID STREP A (OFFICE): Rapid Strep A Screen: NEGATIVE

## 2022-02-12 MED ORDER — FLUTICASONE PROPIONATE 50 MCG/ACT NA SUSP: 1.0000 | Freq: Every day | NASAL | 0 refills | Status: DC

## 2022-02-12 MED ORDER — CHLORASEPTIC 1.4 % MT LIQD: 1.0000 | OROMUCOSAL | 0 refills | Status: DC | PRN

## 2022-02-12 MED ORDER — CETIRIZINE HCL 10 MG PO TABS: 10.0000 mg | ORAL_TABLET | Freq: Every day | ORAL | 0 refills | Status: DC

## 2022-02-12 NOTE — ED Triage Notes (Signed)
Pt c/o otalgia and sore throat, onset ~ 3 days ago

## 2022-02-12 NOTE — ED Provider Notes (Signed)
[name]    CSN: [id number] Arrival date & time: 02/12/22  1254      History   Chief Complaint Chief Complaint  Patient presents with   Otalgia    HPI Barbara Zimmerman is a 31 y.o. female.   Patient presents with sore throat and bilateral ear pain that started yesterday.  Patient denies nasal congestion, runny nose, cough, fever, chest pain, shortness of breath, nausea, vomiting, diarrhea, abdominal pain.  Patient reports that daughter has had similar symptoms recently.  Has taken Mucinex with minimal improvement.   Otalgia   Past Medical History:  Diagnosis Date   Asthma    childhood   Post partum depression    Tonsillitis     There are no problems to display for this patient.   Past Surgical History:  Procedure Laterality Date   ABDOMINAL SURGERY     c secion     CESAREAN SECTION      OB History     Gravida  3   Para  1   Term      Preterm  1   AB  2   Living  2      SAB  1   IAB  1   Ectopic      Multiple  1   Live Births  2            Home Medications    Prior to Admission medications   Medication Sig Start Date End Date Taking? Authorizing Provider  cetirizine (ZYRTEC) 10 MG tablet Take 1 tablet (10 mg total) by mouth daily. 02/12/22  Yes , Acie Fredrickson, FNP  fluticasone (FLONASE) 50 MCG/ACT nasal spray Place 1 spray into both nostrils daily for 3 days. 02/12/22 02/15/22 Yes , Coretta Leisey E, FNP  phenol (CHLORASEPTIC) 1.4 % LIQD Use as directed 1 spray in the mouth or throat as needed for throat irritation / pain. 02/12/22  Yes , Acie Fredrickson, FNP  levonorgestrel-ethinyl estradiol (NORDETTE) 0.15-30 MG-MCG tablet Take 1 tablet by mouth daily. Patient not taking: Reported on 08/10/2021 08/19/20   Brock Bad, MD  TRI-ESTARYLLA 0.18/0.215/0.25 MG-35 MCG tablet Take 1 tablet by mouth daily. 06/28/21   [provider]    Family History Family History  Problem Relation Age of Onset   Diabetes Mother      Social History Social History   Tobacco Use   Smoking status: Some Days    Packs/day: 0.50    Years: 5.00    Total pack years: 2.50    Types: Cigarettes   Smokeless tobacco: Never  Vaping Use   Vaping Use: Never used  Substance Use Topics   Alcohol use: Not Currently   Drug use: No     Allergies   Flagyl [metronidazole] and Oysters [shellfish allergy]   Review of Systems Review of Systems Per HPI  Physical Exam Triage Vital Signs ED Triage Vitals  Enc Vitals Group     BP 02/12/22 1518 118/66     Pulse Rate 02/12/22 1518 68     Resp 02/12/22 1518 16     Temp 02/12/22 1518 98 F (36.7 C)     Temp Source 02/12/22 1518 Oral     SpO2 02/12/22 1518 96 %     Weight --      Height --      Head Circumference --      Peak Flow --      Pain Score 02/12/22 1519 7  Pain Loc --      Pain Edu? --      Excl. in GC? --    No data found.  Updated Vital Signs BP 118/66 (BP Location: Right Arm)   Pulse 68   Temp 98 F (36.7 C) (Oral)   Resp 16   SpO2 96%   Visual Acuity Right Eye Distance:   Left Eye Distance:   Bilateral Distance:    Right Eye Near:   Left Eye Near:    Bilateral Near:     Physical Exam Constitutional:      General: She is not in acute distress.    Appearance: Normal appearance. She is not toxic-appearing or diaphoretic.  HENT:     Head: Normocephalic and atraumatic.     Right Ear: Ear canal normal. No drainage, swelling or tenderness. A middle ear effusion is present. No foreign body. No mastoid tenderness. Tympanic membrane is not perforated, erythematous or bulging.     Left Ear: Ear canal normal. No drainage or swelling. A middle ear effusion is present. No foreign body. No mastoid tenderness. Tympanic membrane is not perforated, erythematous or bulging.     Nose: No congestion.     Mouth/Throat:     Mouth: Mucous membranes are moist.     Pharynx: Posterior oropharyngeal erythema present. No pharyngeal swelling, oropharyngeal  exudate or uvula swelling.     Tonsils: No tonsillar exudate or tonsillar abscesses.  Eyes:     Extraocular Movements: Extraocular movements intact.     Conjunctiva/sclera: Conjunctivae normal.     Pupils: Pupils are equal, round, and reactive to light.  Cardiovascular:     Rate and Rhythm: Normal rate and regular rhythm.     Pulses: Normal pulses.     Heart sounds: Normal heart sounds.  Pulmonary:     Effort: Pulmonary effort is normal. No respiratory distress.     Breath sounds: Normal breath sounds. No wheezing.  Abdominal:     General: Abdomen is flat. Bowel sounds are normal.     Palpations: Abdomen is soft.  Musculoskeletal:        General: Normal range of motion.     Cervical back: Normal range of motion.  Skin:    General: Skin is warm and dry.  Neurological:     General: No focal deficit present.     Mental Status: She is alert and oriented to person, place, and time. Mental status is at baseline.  Psychiatric:        Mood and Affect: Mood normal.        Behavior: Behavior normal.      UC Treatments / Results  Labs (all labs ordered are listed, but only abnormal results are displayed) Labs Reviewed  CULTURE, GROUP A STREP [facility])  POCT RAPID STREP A (OFFICE)    EKG   Radiology No results found.  Procedures Procedures (including critical care time)  Medications Ordered in UC Medications - No data to display  Initial Impression / Assessment and Plan / UC Course  I have reviewed the triage vital signs and the nursing notes.  Pertinent labs & imaging results that were available during my care of the patient were reviewed by me and considered in my medical decision making (see chart for details).     Rapid strep was negative.  Throat culture pending.  Suspect viral pharyngitis.  Patient offered COVID testing but declined.  Patient has fluid behind the eardrums.  Will prescribe cetirizine, Flonase, Chloraseptic spray to help  alleviate symptoms.  Patient  advised to follow-up if symptoms persist or worsen.  No signs of peritonsillar abscess on exam.  Patient verbalized understanding and was agreeable with plan. Final Clinical Impressions(s) / UC Diagnoses   Final diagnoses:  Sore throat  Viral pharyngitis  Acute ear pain, bilateral     Discharge Instructions      Rapid strep is negative.  Throat culture is pending.  We will call if it is abnormal.  Suspect your symptoms are viral in nature.  As we discussed, I have prescribed you 3 medications to help alleviate symptoms.  Please follow-up if symptoms persist or worsen.    ED Prescriptions     Medication Sig Dispense Auth. Provider   cetirizine (ZYRTEC) 10 MG tablet Take 1 tablet (10 mg total) by mouth daily. 30 tablet [place], [place] E, [place]   fluticasone [facility]) 50 MCG/ACT nasal spray Place 1 spray into both nostrils daily for 3 days. 16 g , Hildred Alamin E, [place]   phenol (CHLORASEPTIC) 1.4 % LIQD Use as directed 1 spray in the mouth or throat as needed for throat irritation / pain. 118 mL Teodora Medici, [place]      PDMP not reviewed this encounter.   Teodora Medici, [place] 02/12/22 1610

## 2022-02-12 NOTE — Discharge Instructions (Signed)
Rapid strep is negative.  Throat culture is pending.  We will call if it is abnormal.  Suspect your symptoms are viral in nature.  As we discussed, I have prescribed you 3 medications to help alleviate symptoms.  Please follow-up if symptoms persist or worsen.

## 2022-02-15 LAB — CULTURE, GROUP A STREP (THRC)

## 2022-04-04 ENCOUNTER — Ambulatory Visit
Admission: EM | Admit: 2022-04-04 | Discharge: 2022-04-04 | Disposition: A | Discharge status: Home / Self Care | Payer: Medicaid Other | Attending: Physician Assistant | Admitting: Physician Assistant

## 2022-04-04 DIAGNOSIS — Z113 Encounter for screening for infections with a predominantly sexual mode of transmission: Secondary | ICD-10-CM

## 2022-04-04 DIAGNOSIS — R300 Dysuria: Secondary | ICD-10-CM

## 2022-04-04 DIAGNOSIS — M25571 Pain in right ankle and joints of right foot: Secondary | ICD-10-CM | POA: Diagnosis present

## 2022-04-04 DIAGNOSIS — M545 Low back pain, unspecified: Secondary | ICD-10-CM

## 2022-04-04 LAB — POCT URINALYSIS DIP (MANUAL ENTRY)
Bilirubin, UA: NEGATIVE
Blood, UA: NEGATIVE
Glucose, UA: NEGATIVE mg/dL
Ketones, POC UA: NEGATIVE mg/dL
Leukocytes, UA: NEGATIVE
Nitrite, UA: NEGATIVE
Spec Grav, UA: 1.025 (ref 1.010–1.025)
Urobilinogen, UA: 1 E.U./dL
pH, UA: 7 (ref 5.0–8.0)

## 2022-04-04 LAB — POCT URINE PREGNANCY: Preg Test, Ur: NEGATIVE

## 2022-04-04 MED ORDER — IBUPROFEN 600 MG PO TABS: 600.0000 mg | ORAL_TABLET | Freq: Four times a day (QID) | ORAL | 0 refills | Status: DC | PRN

## 2022-04-04 NOTE — Discharge Instructions (Addendum)
Lab tests will be completed in 48 hours. If you do not receive a call from this office that indicates the labs are normal.  Log on to Mychart to view the results in 48 hours. Advise to continue to use the right ankle support to help reduce the pain.  Take Ibuprofen for pain relief along with icing the area to reduce swelling. Advise to consider Ortho consult if symptoms fail to improve. Advise to follow-up with PCP or return to UC if symptoms fail to improve.

## 2022-04-04 NOTE — ED Triage Notes (Signed)
Pt c/o sharp abd pain in the RLQ that is worse when she leans over. Was having pain in lower right back but now is generalized throughout lower back. Also c/o falling ~ Saturday and now right ankle hurts. Also requesting sti screening.

## 2022-04-04 NOTE — ED Provider Notes (Signed)
[name]    CSN: [id number] Arrival date & time: 04/04/22  0840      History   Chief Complaint Chief Complaint  Patient presents with   RLQ pain    HPI Barbara Zimmerman is a 31 y.o. female.   31 y/o female presents with right ankle pain, lower back pain, sti screening and right lower abdominal pain.  Patient relates her last period was October 23rd and was normal.  Patient desires to have STI screen due to last sexual contact 2 weeks ago and unprotected.  Patient relates she is not having vaginal discharge but is having frequency with strong urine odor.  Patient relates having abdominal pain past several weeks, intermittent, right lower abdomen that is worse with bending, and reaching.  Patient relates she does not have any nausea or fever and no hx of trauma to area.   Patient also relates having lower back pain, mainly on the right side that is localized and worse with bending and lifting.  Patient relates she is not having any numbness or tingling of the lower extremities.  Patient also relates that she has twisted her right ankle several times with the last episode several days ago. Patient relates the pain is worse with standing, and movement of the ankle. Patient relates the pain is lateral right ankle and at the achielles area.  Patient has been using a brace which helps reduce the discomfort.     Past Medical History:  Diagnosis Date   Asthma    childhood   Post partum depression    Tonsillitis     There are no problems to display for this patient.   Past Surgical History:  Procedure Laterality Date   ABDOMINAL SURGERY     c secion     CESAREAN SECTION      OB History     Gravida  3   Para  1   Term      Preterm  1   AB  2   Living  2      SAB  1   IAB  1   Ectopic      Multiple  1   Live Births  2            Home Medications    Prior to Admission medications   Medication Sig Start Date End Date Taking? Authorizing  Provider  ibuprofen (ADVIL) 600 MG tablet Take 1 tablet (600 mg total) by mouth every 6 (six) hours as needed. 04/04/22  Yes Ellsworth Lennox, PA-C  cetirizine (ZYRTEC) 10 MG tablet Take 1 tablet (10 mg total) by mouth daily. 02/12/22   Gustavus Bryant, FNP  fluticasone (FLONASE) 50 MCG/ACT nasal spray Place 1 spray into both nostrils daily for 3 days. 02/12/22 02/15/22  Gustavus Bryant, FNP  levonorgestrel-ethinyl estradiol (NORDETTE) 0.15-30 MG-MCG tablet Take 1 tablet by mouth daily. Patient not taking: Reported on 08/10/2021 08/19/20   Brock Bad, MD  phenol (CHLORASEPTIC) 1.4 % LIQD Use as directed 1 spray in the mouth or throat as needed for throat irritation / pain. 02/12/22   Mound, Acie Fredrickson, FNP  TRI-ESTARYLLA 0.18/0.215/0.25 MG-35 MCG tablet Take 1 tablet by mouth daily. 06/28/21   [provider]    Family History Family History  Problem Relation Age of Onset   Diabetes Mother     Social History Social History   Tobacco Use   Smoking status: Some Days    Packs/day: 0.50  Years: 5.00    Total pack years: 2.50    Types: Cigarettes   Smokeless tobacco: Never  Vaping Use   Vaping Use: Never used  Substance Use Topics   Alcohol use: Not Currently   Drug use: No     Allergies   Flagyl [metronidazole] and Oysters [shellfish allergy]   Review of Systems Review of Systems  Gastrointestinal:  Positive for abdominal pain (right lower abdomen).  Genitourinary:  Positive for frequency. Negative for difficulty urinating.  Musculoskeletal:  Positive for back pain (right lower back).     Physical Exam Triage Vital Signs ED Triage Vitals [04/04/22 0949]  Enc Vitals Group     BP 124/72     Pulse Rate 76     Resp 16     Temp 98.4 F (36.9 C)     Temp Source Oral     SpO2 98 %     Weight      Height      Head Circumference      Peak Flow      Pain Score 10     Pain Loc      Pain Edu?      Excl. in GC?    No data found.  Updated Vital Signs BP 124/72 (BP  Location: Right Arm)   Pulse 76   Temp 98.4 F (36.9 C) (Oral)   Resp 16   SpO2 98%   Visual Acuity Right Eye Distance:   Left Eye Distance:   Bilateral Distance:    Right Eye Near:   Left Eye Near:    Bilateral Near:     Physical Exam Constitutional:      Appearance: Normal appearance.  Abdominal:     General: Abdomen is flat.     Palpations: Abdomen is soft.     Tenderness: There is abdominal tenderness in the right lower quadrant. There is no guarding or rebound.     Comments: Abdomen: pain at the RLQ at the ovarian area.  Musculoskeletal:       Back:     Comments: Back: Pain is located in the S1 area and paraspinous areas on the right without any unusual redness or swelling.  Range of motion is limited with pain along the turning, bending and twisting. Negative straight leg raise bilaterally, strength is intact bilaterally.  Right ankle: Pain is palpated along the lateral malleolus is 1+ swelling with mild redness.  Range of motion is normal with pain with flexion and extension, stability is intact.  Pain is palpated along the posterior Achilles tendons insertion site without any negative swelling.  Neurological:     Mental Status: She is alert.      UC Treatments / Results  Labs (all labs ordered are listed, but only abnormal results are displayed) Labs Reviewed  POCT URINALYSIS DIP (MANUAL ENTRY) - Abnormal; Notable for the following components:      Result Value   Protein Ur, POC trace (*)    All other components within normal limits  POCT URINE PREGNANCY  CERVICOVAGINAL ANCILLARY ONLY    EKG   Radiology No results found.  Procedures Procedures (including critical care time)  Medications Ordered in UC Medications - No data to display  Initial Impression / Assessment and Plan / UC Course  I have reviewed the triage vital signs and the nursing notes.  Pertinent labs & imaging results that were available during my care of the patient were reviewed  by me and considered in my  medical decision making (see chart for details).    Plan: 1.  Screening for STI to be treated with the following: A.  Treatment plan depending on the results of the STI screening. 2.  The ankle pain will be treated with the following: A.  Ibuprofen 600 mg every 8 hours with food to help reduce pain and swelling. B. Consider Ortho consult. 3.  The Dysuria will be treated with the follow: A. Increased fluid intake to flush the urinary system. 4. Advise to follow up with PCP or return to urgent care if symptoms fail to improve. Final Clinical Impressions(s) / UC Diagnoses   Final diagnoses:  Acute right-sided low back pain without sciatica  Routine screening for STI (sexually transmitted infection)  Dysuria  Acute right ankle pain     Discharge Instructions      Lab tests will be completed in 48 hours. If you do not receive a call from this office that indicates the labs are normal.  Log on to Mychart to view the results in 48 hours. Advise to continue to use the right ankle support to help reduce the pain.  Take Ibuprofen for pain relief along with icing the area to reduce swelling. Advise to consider Ortho consult if symptoms fail to improve. Advise to follow-up with PCP or return to UC if symptoms fail to improve.    ED Prescriptions     Medication Sig Dispense Auth. Provider   ibuprofen (ADVIL) 600 MG tablet Take 1 tablet (600 mg total) by mouth every 6 (six) hours as needed. 30 tablet Ellsworth Lennox, PA-C      PDMP not reviewed this encounter.   Ellsworth Lennox, PA-C 04/04/22 1036

## 2022-04-05 ENCOUNTER — Other Ambulatory Visit (HOSPITAL_BASED_OUTPATIENT_CLINIC_OR_DEPARTMENT_OTHER): Payer: Self-pay

## 2022-04-05 ENCOUNTER — Emergency Department (HOSPITAL_BASED_OUTPATIENT_CLINIC_OR_DEPARTMENT_OTHER): Payer: Medicaid Other

## 2022-04-05 ENCOUNTER — Emergency Department (HOSPITAL_BASED_OUTPATIENT_CLINIC_OR_DEPARTMENT_OTHER)
Admission: EM | Admit: 2022-04-05 | Discharge: 2022-04-05 | Disposition: A | Discharge status: Home / Self Care | Payer: Medicaid Other | Attending: Emergency Medicine | Admitting: Emergency Medicine

## 2022-04-05 ENCOUNTER — Encounter (HOSPITAL_BASED_OUTPATIENT_CLINIC_OR_DEPARTMENT_OTHER): Payer: Self-pay | Admitting: Emergency Medicine

## 2022-04-05 ENCOUNTER — Other Ambulatory Visit: Payer: Self-pay

## 2022-04-05 DIAGNOSIS — J45909 Unspecified asthma, uncomplicated: Secondary | ICD-10-CM | POA: Insufficient documentation

## 2022-04-05 DIAGNOSIS — R109 Unspecified abdominal pain: Secondary | ICD-10-CM

## 2022-04-05 DIAGNOSIS — K5900 Constipation, unspecified: Secondary | ICD-10-CM | POA: Insufficient documentation

## 2022-04-05 DIAGNOSIS — M545 Low back pain, unspecified: Secondary | ICD-10-CM | POA: Insufficient documentation

## 2022-04-05 DIAGNOSIS — K700 Alcoholic fatty liver: Secondary | ICD-10-CM | POA: Diagnosis not present

## 2022-04-05 DIAGNOSIS — K2970 Gastritis, unspecified, without bleeding: Secondary | ICD-10-CM | POA: Diagnosis not present

## 2022-04-05 DIAGNOSIS — R1031 Right lower quadrant pain: Secondary | ICD-10-CM | POA: Diagnosis present

## 2022-04-05 DIAGNOSIS — K760 Fatty (change of) liver, not elsewhere classified: Secondary | ICD-10-CM

## 2022-04-05 LAB — CBC WITH DIFFERENTIAL/PLATELET
Abs Immature Granulocytes: 0.01 10*3/uL (ref 0.00–0.07)
Basophils Absolute: 0 10*3/uL (ref 0.0–0.1)
Basophils Relative: 0 %
Eosinophils Absolute: 0.6 10*3/uL — ABNORMAL HIGH (ref 0.0–0.5)
Eosinophils Relative: 10 %
HCT: 40.1 % (ref 36.0–46.0)
Hemoglobin: 12.9 g/dL (ref 12.0–15.0)
Immature Granulocytes: 0 %
Lymphocytes Relative: 42 %
Lymphs Abs: 2.4 10*3/uL (ref 0.7–4.0)
MCH: 29.3 pg (ref 26.0–34.0)
MCHC: 32.2 g/dL (ref 30.0–36.0)
MCV: 90.9 fL (ref 80.0–100.0)
Monocytes Absolute: 0.4 10*3/uL (ref 0.1–1.0)
Monocytes Relative: 7 %
Neutro Abs: 2.4 10*3/uL (ref 1.7–7.7)
Neutrophils Relative %: 41 %
Platelets: 253 10*3/uL (ref 150–400)
RBC: 4.41 MIL/uL (ref 3.87–5.11)
RDW: 13.1 % (ref 11.5–15.5)
WBC: 5.8 10*3/uL (ref 4.0–10.5)
nRBC: 0 % (ref 0.0–0.2)

## 2022-04-05 LAB — URINALYSIS, ROUTINE W REFLEX MICROSCOPIC
Bilirubin Urine: NEGATIVE
Glucose, UA: NEGATIVE mg/dL
Hgb urine dipstick: NEGATIVE
Ketones, ur: NEGATIVE mg/dL
Leukocytes,Ua: NEGATIVE
Nitrite: NEGATIVE
Protein, ur: NEGATIVE mg/dL
Specific Gravity, Urine: 1.027 (ref 1.005–1.030)
pH: 6.5 (ref 5.0–8.0)

## 2022-04-05 LAB — COMPREHENSIVE METABOLIC PANEL
ALT: 11 U/L (ref 0–44)
AST: 14 U/L — ABNORMAL LOW (ref 15–41)
Albumin: 3.9 g/dL (ref 3.5–5.0)
Alkaline Phosphatase: 34 U/L — ABNORMAL LOW (ref 38–126)
Anion gap: 6 (ref 5–15)
BUN: 17 mg/dL (ref 6–20)
CO2: 28 mmol/L (ref 22–32)
Calcium: 9.3 mg/dL (ref 8.9–10.3)
Chloride: 104 mmol/L (ref 98–111)
Creatinine, Ser: 0.68 mg/dL (ref 0.44–1.00)
GFR, Estimated: >60 mL/min (ref >60)
Glucose, Bld: 72 mg/dL (ref 70–99)
Potassium: 4 mmol/L (ref 3.5–5.1)
Sodium: 138 mmol/L (ref 135–145)
Total Bilirubin: 0.3 mg/dL (ref 0.3–1.2)
Total Protein: 6.9 g/dL (ref 6.5–8.1)

## 2022-04-05 LAB — WET PREP, GENITAL
Clue Cells Wet Prep HPF POC: NONE SEEN
Sperm: NONE SEEN
Trich, Wet Prep: NONE SEEN
WBC, Wet Prep HPF POC: <10 (ref <10)
Yeast Wet Prep HPF POC: NONE SEEN

## 2022-04-05 LAB — CERVICOVAGINAL ANCILLARY ONLY
Bacterial Vaginitis (gardnerella): NEGATIVE
Candida Glabrata: NEGATIVE
Candida Vaginitis: POSITIVE — AB
Chlamydia: NEGATIVE
Comment: NEGATIVE
Comment: NEGATIVE
Comment: NEGATIVE
Comment: NEGATIVE
Comment: NEGATIVE
Comment: NORMAL
Neisseria Gonorrhea: NEGATIVE
Trichomonas: NEGATIVE

## 2022-04-05 LAB — LIPASE, BLOOD: Lipase: 17 U/L (ref 11–51)

## 2022-04-05 MED ORDER — KETOROLAC TROMETHAMINE 30 MG/ML IJ SOLN
30.0000 mg | Freq: Once | INTRAMUSCULAR | Status: AC
  Administered 2022-04-05: 30 mg via INTRAVENOUS
  Filled 2022-04-05: qty 1

## 2022-04-05 MED ORDER — FAMOTIDINE 20 MG PO TABS
20.0000 mg | ORAL_TABLET | Freq: Two times a day (BID) | ORAL | 0 refills | Status: DC
  Filled 2022-04-05: qty 30, 15d supply, fill #0

## 2022-04-05 MED ORDER — OMEPRAZOLE 20 MG PO CPDR
20.0000 mg | DELAYED_RELEASE_CAPSULE | Freq: Every day | ORAL | 0 refills | Status: DC
  Filled 2022-04-05: qty 30, 30d supply, fill #0

## 2022-04-05 MED ORDER — POLYETHYLENE GLYCOL 3350 17 G PO PACK
17.0000 g | PACK | Freq: Every day | ORAL | 0 refills | Status: DC
  Filled 2022-04-05: qty 14, 14d supply, fill #0

## 2022-04-05 MED ORDER — ONDANSETRON HCL 4 MG/2ML IJ SOLN
4.0000 mg | Freq: Once | INTRAMUSCULAR | Status: AC
  Administered 2022-04-05: 4 mg via INTRAVENOUS
  Filled 2022-04-05: qty 2

## 2022-04-05 MED ORDER — SODIUM CHLORIDE 0.9 % IV SOLN
1.0000 g | Freq: Once | INTRAVENOUS | Status: DC
  Filled 2022-04-05: qty 10

## 2022-04-05 MED ORDER — DOXYCYCLINE HYCLATE 100 MG PO CAPS
100.0000 mg | ORAL_CAPSULE | Freq: Two times a day (BID) | ORAL | 0 refills | Status: AC
  Filled 2022-04-05: qty 14, 7d supply, fill #0

## 2022-04-05 MED ORDER — LIDOCAINE HCL (PF) 1 % IJ SOLN
INTRAMUSCULAR | Status: AC
  Administered 2022-04-05: 1 mL
  Filled 2022-04-05: qty 5

## 2022-04-05 MED ORDER — IOHEXOL 300 MG/ML  SOLN
100.0000 mL | Freq: Once | INTRAMUSCULAR | Status: AC | PRN
  Administered 2022-04-05: 100 mL via INTRAVENOUS

## 2022-04-05 MED ORDER — CEFTRIAXONE SODIUM 500 MG IJ SOLR
500.0000 mg | Freq: Once | INTRAMUSCULAR | Status: AC
  Administered 2022-04-05: 500 mg via INTRAMUSCULAR
  Filled 2022-04-05: qty 500

## 2022-04-05 NOTE — ED Triage Notes (Signed)
Pt arrives to ED with c/o right sided flank pain with associated burning/cramping abdominal pain over 2 weeks. Was seen at [facility] yesterday, had negative preg, urine, and in process STI testing.

## 2022-04-05 NOTE — Discharge Instructions (Addendum)
  You have been seen in the Emergency Department (ED) for abdominal pain and back pain. Your workup was generally reassuring.  Your CT scan showed evidence of gastritis which is common with inflammation of the stomach and reflux symptoms.  He also had evidence of constipation.  These can contribute to pain.  You can take the Pepcid and omeprazole for gastritis and the MiraLAX for constipation.  For your back pain you can continue to do Tylenol and ibuprofen as needed.  There were no abnormalities of your uterus or ovary seen on the CT scan.  Take the antibiotics as prescribed for STD treatment however if your STD tests are negative you can discontinue the antibiotics.  Please follow up with your primary care doctor as soon as possible regarding today's ED visit and the symptoms that are bothering you.  We have also made a referral to gastroenterology for you to follow-up within the outpatient setting.  Return to the ED if your abdominal pain worsens or fails to improve, you develop bloody vomiting, bloody diarrhea, you are unable to tolerate fluids due to vomiting, fever greater than 101, or any other concerning symptoms.

## 2022-04-05 NOTE — ED Provider Notes (Signed)
[facility] EMERGENCY DEPT Provider Note   CSN: [id number] Arrival date & time: 04/05/22  8338     History  Chief Complaint  Patient presents with   Flank Pain    Barbara Zimmerman is a 31 y.o. female.  With PMH asthma reporting multiple symptoms.  She has lower back pain and upper mid back pain on the right side that is nonradiating with no associated weakness, numbness or tingling or loss of bladder or bowels that is worse with movement and palpation.  It is throbbing and stabbing in nature.  She does have 2 young boys with autism which keeps her busy and she also goes to the gym often.  However there have been no recent traumatic injuries.  She also complained of diffuse abdominal pain but worse in the epigastrium that is burning in nature.  She has had no fevers, no vomiting, no diarrhea, no urinary symptoms.  She does note abnormal vaginal discharge 2 weeks ago but none since.  She had unprotected sex about 3 weeks prior.  She went to urgent care yesterday where she had STD testing performed that is still in process.  She still not have results yet.  She would rather just be treated for possible STD while waiting to find out.  Her last menstrual period was a couple weeks prior.   Flank Pain       Home Medications Prior to Admission medications   Medication Sig Start Date End Date Taking? Authorizing Provider  doxycycline (VIBRAMYCIN) 100 MG capsule Take 1 capsule (100 mg total) by mouth 2 (two) times daily for 7 days. 04/05/22 04/12/22 Yes Mardene Sayer, MD  famotidine (PEPCID) 20 MG tablet Take 1 tablet (20 mg total) by mouth 2 (two) times daily. 04/05/22  Yes Mardene Sayer, MD  omeprazole (PRILOSEC) 20 MG capsule Take 1 capsule (20 mg total) by mouth daily. 04/05/22 05/05/22 Yes Mardene Sayer, MD  polyethylene glycol (MIRALAX) 17 g packet Take 17 g by mouth daily. 04/05/22  Yes Mardene Sayer, MD  cetirizine (ZYRTEC) 10 MG tablet Take 1 tablet (10 mg  total) by mouth daily. 02/12/22   Gustavus Bryant, FNP  fluticasone (FLONASE) 50 MCG/ACT nasal spray Place 1 spray into both nostrils daily for 3 days. 02/12/22 02/15/22  Gustavus Bryant, FNP  ibuprofen (ADVIL) 600 MG tablet Take 1 tablet (600 mg total) by mouth every 6 (six) hours as needed. 04/04/22   Ellsworth Lennox, PA-C  levonorgestrel-ethinyl estradiol (NORDETTE) 0.15-30 MG-MCG tablet Take 1 tablet by mouth daily. Patient not taking: Reported on 08/10/2021 08/19/20   Brock Bad, MD  phenol (CHLORASEPTIC) 1.4 % LIQD Use as directed 1 spray in the mouth or throat as needed for throat irritation / pain. 02/12/22   Mound, Acie Fredrickson, FNP  TRI-ESTARYLLA 0.18/0.215/0.25 MG-35 MCG tablet Take 1 tablet by mouth daily. 06/28/21   [provider]      Allergies    Flagyl [metronidazole] and Laqueta Jean allergy]    Review of Systems   Review of Systems  Genitourinary:  Positive for flank pain.    Physical Exam Updated Vital Signs BP 137/62 (BP Location: Left Arm)   Pulse 68   Temp 98 F (36.7 C) (Oral)   Resp 16   Ht 5\' 6"  (1.676 m)   Wt 99.8 kg   SpO2 100%   BMI 35.51 kg/m  Physical Exam Constitutional: Alert and oriented. Well appearing and in no distress. Eyes: Conjunctivae are normal.  ENT      Head: Normocephalic and atraumatic.      Nose: No congestion.      Mouth/Throat: Mucous membranes are moist.      Neck: No stridor. Cardiovascular: S1, S2,  Normal and symmetric distal pulses are present in all extremities.Warm and well perfused. Respiratory: Normal respiratory effort. Breath sounds are normal. Gastrointestinal: Soft and diffuse ttp, worse in epigastrium, no rebound, no guarding, no CVAT. Musculoskeletal: Normal range of motion in all extremities.  Right thoracic and lumbar paraspinal tenderness to palpation, no external evidence of trauma, no midline tenderness step-offs or deformities.      Right lower leg: No tenderness or edema.      Left lower leg: No  tenderness or edema. Neurologic: Normal speech and language.  No facial droop.  Equal strength of bilateral upper and lower extremities.  Sensation grossly intact in all extremities.  Steady ambulatory gait.  No gross focal neurologic deficits are appreciated. Skin: Skin is warm, dry and intact. No rash noted. Psychiatric: Mood and affect are normal. Speech and behavior are normal.   ED Results / Procedures / Treatments   Labs (all labs ordered are listed, but only abnormal results are displayed) Labs Reviewed  COMPREHENSIVE METABOLIC PANEL - Abnormal; Notable for the following components:      Result Value   AST 14 (*)    Alkaline Phosphatase 34 (*)    All other components within normal limits  CBC WITH DIFFERENTIAL/PLATELET - Abnormal; Notable for the following components:   Eosinophils Absolute 0.6 (*)    All other components within normal limits  WET PREP, GENITAL  URINALYSIS, ROUTINE W REFLEX MICROSCOPIC  LIPASE, BLOOD    EKG None  Radiology CT ABDOMEN PELVIS W CONTRAST  Result Date: 04/05/2022 CLINICAL DATA:  Right flank pain, abnormal uterine discharge burning/cramping abdominal pain over 2 weeks. EXAM: CT ABDOMEN AND PELVIS WITH CONTRAST TECHNIQUE: Multidetector CT imaging of the abdomen and pelvis was performed using the standard protocol following bolus administration of intravenous contrast. RADIATION DOSE REDUCTION: This exam was performed according to the departmental dose-optimization program which includes automated exposure control, adjustment of the mA and/or kV according to patient size and/or use of iterative reconstruction technique. CONTRAST:  OMNIPAQUE IOHEXOL 300 MG/ML  SOLN COMPARISON:  Pelvic ultrasound June 29, 2020. FINDINGS: Lower chest: No acute abnormality. Hepatobiliary: Segment IV hepatic hypodensity for instance on image 23/2 commonly reflects hepatic steatosis or altered perfusion (veins of Sappey). Gallbladder is nondistended. No biliary ductal  dilation. Pancreas: No pancreatic ductal dilation or evidence of acute inflammation. Spleen: No splenomegaly or focal splenic lesion. Adrenals/Urinary Tract: Bilateral adrenal glands appear normal. No hydronephrosis. Kidneys demonstrate symmetric enhancement. Urinary bladder is unremarkable for degree of distension. Stomach/Bowel: Mild wall thickening versus underdistention of the gastric antrum. No pathologic dilation of small or large bowel. The appendix and terminal ileum appear normal. Moderate volume of formed stool in the proximal colon. Vascular/Lymphatic: Normal caliber abdominal aorta. No pathologically enlarged abdominal or pelvic lymph nodes. Reproductive: Uterus and bilateral adnexa are unremarkable in CT appearance for reproductive age female. Other: Trace pelvic free fluid is within physiologic normal limits. Musculoskeletal: No acute or significant osseous findings. IMPRESSION: 1. Mild wall thickening versus underdistention of the gastric antrum. Correlate for gastritis. 2. Moderate volume of formed stool in the proximal colon. 3. Segment IV hepatic hypodensity commonly reflects hepatic steatosis or altered perfusion (veins of Sappey). If more definitive characterization is clinically indicated consider further evaluation by nonemergent MRI of  the abdomen with and without contrast preferably as an outpatient upon resolution of patient's current symptomatology when they are better able to follow commands including breath hold. Electronically Signed   By: Maudry Mayhew M.D.   On: 04/05/2022 13:11    Procedures Procedures   Medications Ordered in ED Medications  cefTRIAXone (ROCEPHIN) 1 g in sodium chloride 0.9 % 100 mL IVPB (1 g Intravenous Not Given 04/05/22 1136)  ketorolac (TORADOL) 30 MG/ML injection 30 mg (30 mg Intravenous Given 04/05/22 1135)  ondansetron (ZOFRAN) injection 4 mg (4 mg Intravenous Given 04/05/22 1136)  iohexol (OMNIPAQUE) 300 MG/ML solution 100 mL (100 mLs Intravenous  Contrast Given 04/05/22 1252)  cefTRIAXone (ROCEPHIN) injection 500 mg (500 mg Intramuscular Given 04/05/22 1443)  lidocaine (PF) (XYLOCAINE) 1 % injection (1 mL  Given 04/05/22 1443)    ED Course/ Medical Decision Making/ A&P Clinical Course as of 04/05/22 1827  Thu Apr 05, 2022  1151 Labs reviewed and all generally unremarkable.  She has a normal white blood cell count 5.8.  No anemia hemoglobin 12.9.  UA with no evidence of UTI no RBCs or blood concerning for nephrolithiasis.  She has no transaminitis and normal lipase, unlikely biliary pathology and unlikely pancreatitis. [VB]    Clinical Course User Index [VB] Mardene Sayer, MD                           Medical Decision Making Sharne JESTINE BICKNELL is a 31 y.o. female.  With PMH asthma reporting multiple symptoms.   Regarding patient's back pain which is palpable and reproducible with no associated neurologic deficits, suspect more likely musculoskeletal strain.  No concern for cauda equina.  Treating conservatively and advised stretching and NSAIDs.  Regarding abdominal pain, suspect more likely gastritis.  A CT scan was obtained which showed evidence of gastritis, constipation and hepatic steatosis.  Her labs were all unremarkable with normal lipase and no evidence of pancreatitis.  There is no evidence of gallbladder pathology her UA showed no evidence of UTI.  We treated patient empirically for STD per her request she was given a dose of Rocephin and 7 days of doxycycline we will follow-up with her results of STD testing.  She was given Pepcid and Maalox for symptom control and referral to GI in outpatient setting.    Amount and/or Complexity of Data Reviewed Labs: ordered. Radiology: ordered.  Risk Prescription drug management.    Final Clinical Impression(s) / ED Diagnoses Final diagnoses:  Abdominal pain, unspecified abdominal location  Gastritis without bleeding, unspecified chronicity, unspecified gastritis type  Fatty  liver  Constipation, unspecified constipation type    Rx / DC Orders ED Discharge Orders          Ordered    famotidine (PEPCID) 20 MG tablet  2 times daily        04/05/22 1433    polyethylene glycol (MIRALAX) 17 g packet  Daily        04/05/22 1433    doxycycline (VIBRAMYCIN) 100 MG capsule  2 times daily        04/05/22 1433    omeprazole (PRILOSEC) 20 MG capsule  Daily        04/05/22 1433    Ambulatory referral to Gastroenterology        04/05/22 1433              Mardene Sayer, MD 04/05/22 1827

## 2022-04-06 ENCOUNTER — Telehealth (HOSPITAL_COMMUNITY): Payer: Self-pay

## 2022-04-06 MED ORDER — FLUCONAZOLE 150 MG PO TABS: 150.0000 mg | ORAL_TABLET | Freq: Every day | ORAL | 0 refills | Status: DC

## 2022-04-26 ENCOUNTER — Ambulatory Visit
Admission: EM | Admit: 2022-04-26 | Discharge: 2022-04-26 | Disposition: A | Discharge status: Home / Self Care | Payer: Medicaid Other | Attending: Physician Assistant | Admitting: Physician Assistant

## 2022-04-26 DIAGNOSIS — N898 Other specified noninflammatory disorders of vagina: Secondary | ICD-10-CM | POA: Insufficient documentation

## 2022-04-26 MED ORDER — FLUCONAZOLE 150 MG PO TABS: ORAL_TABLET | ORAL | 0 refills | Status: DC

## 2022-04-26 NOTE — ED Provider Notes (Signed)
[name]    CSN: [id number] Arrival date & time: 04/26/22  0807      History   Chief Complaint Chief Complaint  Patient presents with   Vaginal Discharge    HPI Barbara Zimmerman is a 31 y.o. female.   Patient here today for evaluation of vaginal discharge and irritation. She reports having one episode of fishy odor earlier today. She does not report abdominal pain or back pain. She does not report dysuria. She does not have fever. She does not report treatment for symptoms.   The history is provided by the patient.    Past Medical History:  Diagnosis Date   Asthma    childhood   Post partum depression    Tonsillitis     There are no problems to display for this patient.   Past Surgical History:  Procedure Laterality Date   ABDOMINAL SURGERY     c secion     CESAREAN SECTION      OB History     Gravida  3   Para  1   Term      Preterm  1   AB  2   Living  2      SAB  1   IAB  1   Ectopic      Multiple  1   Live Births  2            Home Medications    Prior to Admission medications   Medication Sig Start Date End Date Taking? Authorizing Provider  fluconazole (DIFLUCAN) 150 MG tablet Take one tab PO today and repeat dose in 3 days if symptoms persist 04/26/22  Yes Tomi Bamberger, PA-C  cetirizine (ZYRTEC) 10 MG tablet Take 1 tablet (10 mg total) by mouth daily. 02/12/22   Gustavus Bryant, FNP  famotidine (PEPCID) 20 MG tablet Take 1 tablet (20 mg total) by mouth 2 (two) times daily. 04/05/22   Mardene Sayer, MD  fluticasone (FLONASE) 50 MCG/ACT nasal spray Place 1 spray into both nostrils daily for 3 days. 02/12/22 02/15/22  Gustavus Bryant, FNP  ibuprofen (ADVIL) 600 MG tablet Take 1 tablet (600 mg total) by mouth every 6 (six) hours as needed. 04/04/22   Ellsworth Lennox, PA-C  levonorgestrel-ethinyl estradiol (NORDETTE) 0.15-30 MG-MCG tablet Take 1 tablet by mouth daily. Patient not taking: Reported on 08/10/2021 08/19/20    Brock Bad, MD  omeprazole (PRILOSEC) 20 MG capsule Take 1 capsule (20 mg total) by mouth daily. 04/05/22 05/05/22  Mardene Sayer, MD  phenol (CHLORASEPTIC) 1.4 % LIQD Use as directed 1 spray in the mouth or throat as needed for throat irritation / pain. 02/12/22   Gustavus Bryant, FNP  polyethylene glycol (MIRALAX) 17 g packet Take 17 g by mouth daily. 04/05/22   Mardene Sayer, MD  TRI-ESTARYLLA 0.18/0.215/0.25 MG-35 MCG tablet Take 1 tablet by mouth daily. 06/28/21   [provider]    Family History Family History  Problem Relation Age of Onset   Diabetes Mother     Social History Social History   Tobacco Use   Smoking status: Some Days    Packs/day: 0.50    Years: 5.00    Total pack years: 2.50    Types: Cigarettes   Smokeless tobacco: Never  Vaping Use   Vaping Use: Never used  Substance Use Topics   Alcohol use: Not Currently   Drug use: No     Allergies  Flagyl [metronidazole] and Oysters [shellfish allergy]   Review of Systems Review of Systems  Constitutional:  Negative for chills and fever.  Eyes:  Negative for discharge and redness.  Gastrointestinal:  Negative for abdominal pain, nausea and vomiting.  Genitourinary:  Positive for vaginal discharge. Negative for dysuria.     Physical Exam Triage Vital Signs ED Triage Vitals  Enc Vitals Group     BP      Pulse      Resp      Temp      Temp src      SpO2      Weight      Height      Head Circumference      Peak Flow      Pain Score      Pain Loc      Pain Edu?      Excl. in [place]?    No data found.  Updated Vital Signs BP 122/85 (BP Location: Left Arm)   Pulse 70   Temp 98.1 F (36.7 C) (Oral)   Resp 16   SpO2 97%     Physical Exam Vitals and nursing note reviewed.  Constitutional:      General: She is not in acute distress.    Appearance: Normal appearance. She is not ill-appearing.  HENT:     Head: Normocephalic and atraumatic.  Eyes:      Conjunctiva/sclera: Conjunctivae normal.  Cardiovascular:     Rate and Rhythm: Normal rate.  Pulmonary:     Effort: Pulmonary effort is normal.  Neurological:     Mental Status: She is alert.  Psychiatric:        Mood and Affect: Mood normal.        Behavior: Behavior normal.        Thought Content: Thought content normal.      UC Treatments / Results  Labs (all labs ordered are listed, but only abnormal results are displayed) Labs Reviewed  POCT URINALYSIS DIP (MANUAL ENTRY)  CERVICOVAGINAL ANCILLARY ONLY    EKG   Radiology No results found.  Procedures Procedures (including critical care time)  Medications Ordered in UC Medications - No data to display  Initial Impression / Assessment and Plan / UC Course  I have reviewed the triage vital signs and the nursing notes.  Pertinent labs & imaging results that were available during my care of the patient were reviewed by me and considered in my medical decision making (see chart for details).    Will treat with diflucan to cover yeast and screening ordered for BV, trichomonas, gonorrhea, and chlamydia. Encouraged follow up with any further concerns.   Final Clinical Impressions(s) / UC Diagnoses   Final diagnoses:  Vaginal discharge   Discharge Instructions   None    ED Prescriptions     Medication Sig Dispense Auth. Provider   fluconazole (DIFLUCAN) 150 MG tablet Take one tab PO today and repeat dose in 3 days if symptoms persist 2 tablet Tomi Bamberger, PA-C      PDMP not reviewed this encounter.   Tomi Bamberger, PA-C 04/26/22 [phone]

## 2022-04-26 NOTE — ED Triage Notes (Signed)
Pt c/o vaginal itching, odor, mild discharge onset ~ 4 days ago concerned for yeast infection states she thinks it is because she was taking omeprazole and famotidine.

## 2022-04-27 LAB — CERVICOVAGINAL ANCILLARY ONLY
Bacterial Vaginitis (gardnerella): NEGATIVE
Candida Glabrata: NEGATIVE
Candida Vaginitis: NEGATIVE
Chlamydia: NEGATIVE
Comment: NEGATIVE
Comment: NEGATIVE
Comment: NEGATIVE
Comment: NEGATIVE
Comment: NEGATIVE
Comment: NORMAL
Neisseria Gonorrhea: NEGATIVE
Trichomonas: NEGATIVE

## 2022-05-01 ENCOUNTER — Other Ambulatory Visit (HOSPITAL_BASED_OUTPATIENT_CLINIC_OR_DEPARTMENT_OTHER): Payer: Self-pay

## 2022-05-05 ENCOUNTER — Other Ambulatory Visit (HOSPITAL_BASED_OUTPATIENT_CLINIC_OR_DEPARTMENT_OTHER): Payer: Self-pay

## 2022-05-08 ENCOUNTER — Other Ambulatory Visit (HOSPITAL_BASED_OUTPATIENT_CLINIC_OR_DEPARTMENT_OTHER): Payer: Self-pay

## 2022-05-17 ENCOUNTER — Other Ambulatory Visit (HOSPITAL_BASED_OUTPATIENT_CLINIC_OR_DEPARTMENT_OTHER): Payer: Self-pay

## 2022-05-22 ENCOUNTER — Other Ambulatory Visit (HOSPITAL_BASED_OUTPATIENT_CLINIC_OR_DEPARTMENT_OTHER): Payer: Self-pay

## 2022-05-30 ENCOUNTER — Other Ambulatory Visit (HOSPITAL_BASED_OUTPATIENT_CLINIC_OR_DEPARTMENT_OTHER): Payer: Self-pay

## 2022-06-07 ENCOUNTER — Ambulatory Visit
Admission: EM | Admit: 2022-06-07 | Discharge: 2022-06-07 | Disposition: A | Discharge status: Home / Self Care | Payer: Medicaid Other | Attending: Physician Assistant | Admitting: Physician Assistant

## 2022-06-07 DIAGNOSIS — N898 Other specified noninflammatory disorders of vagina: Secondary | ICD-10-CM | POA: Diagnosis present

## 2022-06-07 DIAGNOSIS — Z3202 Encounter for pregnancy test, result negative: Secondary | ICD-10-CM | POA: Diagnosis not present

## 2022-06-07 LAB — POCT URINALYSIS DIP (MANUAL ENTRY)
Bilirubin, UA: NEGATIVE
Blood, UA: NEGATIVE
Glucose, UA: NEGATIVE mg/dL
Ketones, POC UA: NEGATIVE mg/dL
Leukocytes, UA: NEGATIVE
Nitrite, UA: NEGATIVE
Protein Ur, POC: NEGATIVE mg/dL
Spec Grav, UA: >=1.03 — AB (ref 1.010–1.025)
Urobilinogen, UA: 0.2 E.U./dL
pH, UA: 6 (ref 5.0–8.0)

## 2022-06-07 LAB — POCT URINE PREGNANCY: Preg Test, Ur: NEGATIVE

## 2022-06-07 MED ORDER — METRONIDAZOLE 1 % EX GEL: Freq: Every day | CUTANEOUS | 0 refills | Status: DC

## 2022-06-07 NOTE — ED Triage Notes (Addendum)
Pt presents to uc with co of vaginal discharge and discomfort. wants to have check for sti, bv and yeast. Pt reports symptoms for 2 weeks. Pt reports no pain. Pt reports strong urine smell no dysuria.

## 2022-06-07 NOTE — ED Provider Notes (Signed)
[name]    CSN: [id number] Arrival date & time: 06/07/22  0806      History   Chief Complaint Chief Complaint  Patient presents with   sti check     HPI Barbara Zimmerman is a 32 y.o. female.   Patient here today for evaluation of reported vaginal discharge. She notes a "fishy" vaginal odor. She does have history of BV.  She requests STD screening as well as screening for BV and yeast. She denies any pain. She notes she has had symptoms for 2 weeks. She denies any known exposure.   The history is provided by the patient.    Past Medical History:  Diagnosis Date   Asthma    childhood   Post partum depression    Tonsillitis     There are no problems to display for this patient.   Past Surgical History:  Procedure Laterality Date   ABDOMINAL SURGERY     c secion     CESAREAN SECTION      OB History     Gravida  3   Para  1   Term      Preterm  1   AB  2   Living  2      SAB  1   IAB  1   Ectopic      Multiple  1   Live Births  2            Home Medications    Prior to Admission medications   Medication Sig Start Date End Date Taking? Authorizing Provider  metroNIDAZOLE (METROGEL) 1 % gel Apply topically daily. 06/07/22  Yes Francene Finders, PA-C  omeprazole (PRILOSEC) 20 MG capsule Take 1 capsule (20 mg total) by mouth daily. 04/05/22 05/05/22  Elgie Congo, MD  TRI-ESTARYLLA 0.18/0.215/0.25 MG-35 MCG tablet Take 1 tablet by mouth daily. 06/28/21   [provider]    Family History Family History  Problem Relation Age of Onset   Diabetes Mother     Social History Social History   Tobacco Use   Smoking status: Some Days    Packs/day: 0.50    Years: 5.00    Total pack years: 2.50    Types: Cigarettes   Smokeless tobacco: Never  Vaping Use   Vaping Use: Never used  Substance Use Topics   Alcohol use: Not Currently   Drug use: No     Allergies   Flagyl [metronidazole] and Oysters [shellfish  allergy]   Review of Systems Review of Systems  Constitutional:  Negative for chills and fever.  Eyes:  Negative for discharge and redness.  Respiratory:  Negative for shortness of breath.   Gastrointestinal:  Negative for abdominal pain, nausea and vomiting.  Genitourinary:  Positive for vaginal discharge. Negative for dysuria, frequency, genital sores and vaginal pain.     Physical Exam Triage Vital Signs ED Triage Vitals  Enc Vitals Group     BP      Pulse      Resp      Temp      Temp src      SpO2      Weight      Height      Head Circumference      Peak Flow      Pain Score      Pain Loc      Pain Edu?      Excl. in [place]?  No data found.  Updated Vital Signs BP 125/79   Pulse 66   Temp (!) 97.4 F (36.3 C) (Oral)   Resp 19   LMP  (Within Months)   SpO2 98%      Physical Exam Vitals and nursing note reviewed.  Constitutional:      General: She is not in acute distress.    Appearance: Normal appearance. She is not ill-appearing.  HENT:     Head: Normocephalic and atraumatic.  Eyes:     Conjunctiva/sclera: Conjunctivae normal.  Cardiovascular:     Rate and Rhythm: Normal rate.  Pulmonary:     Effort: Pulmonary effort is normal. No respiratory distress.  Neurological:     Mental Status: She is alert.  Psychiatric:        Mood and Affect: Mood normal.        Behavior: Behavior normal.        Thought Content: Thought content normal.      UC Treatments / Results  Labs (all labs ordered are listed, but only abnormal results are displayed) Labs Reviewed  POCT URINALYSIS DIP (MANUAL ENTRY) - Abnormal; Notable for the following components:      Result Value   Spec Grav, UA >=1.030 (*)    All other components within normal limits  POCT URINE PREGNANCY  CERVICOVAGINAL ANCILLARY ONLY    EKG   Radiology No results found.  Procedures Procedures (including critical care time)  Medications Ordered in UC Medications - No data to  display  Initial Impression / Assessment and Plan / UC Course  I have reviewed the triage vital signs and the nursing notes.  Pertinent labs & imaging results that were available during my care of the patient were reviewed by me and considered in my medical decision making (see chart for details).    STD screening ordered as requested. Urine pregnancy and UA negative in office. Will prescribe metrogel at patient request given malodorous discharge and history of BV and will await additional results for further recommendation. Encouraged follow up sooner with any worsening symptoms.   Final Clinical Impressions(s) / UC Diagnoses   Final diagnoses:  Vaginal discharge   Discharge Instructions   None    ED Prescriptions     Medication Sig Dispense Auth. Provider   metroNIDAZOLE (METROGEL) 1 % gel Apply topically daily. 45 g Francene Finders, PA-C      PDMP not reviewed this encounter.   Francene Finders, PA-C 06/07/22 [phone]

## 2022-06-10 LAB — CERVICOVAGINAL ANCILLARY ONLY
Bacterial Vaginitis (gardnerella): NEGATIVE
Candida Glabrata: NEGATIVE
Candida Vaginitis: NEGATIVE
Chlamydia: NEGATIVE
Comment: NEGATIVE
Comment: NEGATIVE
Comment: NEGATIVE
Comment: NEGATIVE
Comment: NEGATIVE
Comment: NORMAL
Neisseria Gonorrhea: NEGATIVE
Trichomonas: NEGATIVE

## 2022-09-04 ENCOUNTER — Encounter: Payer: Self-pay | Admitting: Emergency Medicine

## 2022-09-04 ENCOUNTER — Other Ambulatory Visit: Payer: Self-pay

## 2022-09-04 ENCOUNTER — Ambulatory Visit
Admission: EM | Admit: 2022-09-04 | Discharge: 2022-09-04 | Disposition: A | Discharge status: Home / Self Care | Payer: Medicaid Other | Attending: Family Medicine | Admitting: Family Medicine

## 2022-09-04 DIAGNOSIS — J029 Acute pharyngitis, unspecified: Secondary | ICD-10-CM | POA: Insufficient documentation

## 2022-09-04 DIAGNOSIS — Z202 Contact with and (suspected) exposure to infections with a predominantly sexual mode of transmission: Secondary | ICD-10-CM | POA: Diagnosis present

## 2022-09-04 LAB — POCT URINALYSIS DIP (MANUAL ENTRY)
Blood, UA: NEGATIVE
Glucose, UA: NEGATIVE mg/dL
Ketones, POC UA: NEGATIVE mg/dL
Leukocytes, UA: NEGATIVE
Nitrite, UA: NEGATIVE
Protein Ur, POC: NEGATIVE mg/dL
Spec Grav, UA: >=1.03 — AB (ref 1.010–1.025)
Urobilinogen, UA: 1 E.U./dL
pH, UA: 6 (ref 5.0–8.0)

## 2022-09-04 LAB — POCT RAPID STREP A (OFFICE): Rapid Strep A Screen: NEGATIVE

## 2022-09-04 LAB — POCT URINE PREGNANCY: Preg Test, Ur: NEGATIVE

## 2022-09-04 MED ORDER — FLUCONAZOLE 150 MG PO TABS: 150.0000 mg | ORAL_TABLET | ORAL | 0 refills | Status: AC

## 2022-09-04 MED ORDER — AMOXICILLIN 875 MG PO TABS: 875.0000 mg | ORAL_TABLET | Freq: Two times a day (BID) | ORAL | 0 refills | Status: AC

## 2022-09-04 NOTE — ED Provider Notes (Signed)
[name]    CSN: [id number] Arrival date & time: 09/04/22  1046      History   Chief Complaint Chief Complaint  Patient presents with   Sore Throat   Back Pain    HPI Barbara Zimmerman is a 32 y.o. female.    Sore Throat  Back Pain   Here for sore throat and right ear pain.  It began about 2 days ago.  It was after she had oral sex.  No fever or chills and no cough or congestion.  No itching in the ear.  She has also had some low back pain and is concerned about sexually transmitted diseases.  No dysuria but she has had a little vaginal itching.  Periods are irregular  Past Medical History:  Diagnosis Date   Asthma    childhood   Post partum depression    Tonsillitis     There are no problems to display for this patient.   Past Surgical History:  Procedure Laterality Date   ABDOMINAL SURGERY     c secion     CESAREAN SECTION      OB History     Gravida  3   Para  1   Term      Preterm  1   AB  2   Living  2      SAB  1   IAB  1   Ectopic      Multiple  1   Live Births  2            Home Medications    Prior to Admission medications   Medication Sig Start Date End Date Taking? Authorizing Provider  amoxicillin (AMOXIL) 875 MG tablet Take 1 tablet (875 mg total) by mouth 2 (two) times daily for 10 days. 09/04/22 09/14/22 Yes Zenia Resides, MD  fluconazole (DIFLUCAN) 150 MG tablet Take 1 tablet (150 mg total) by mouth every 3 (three) days for 2 doses. 09/04/22 09/08/22 Yes Lashun Ramseyer, Janace Aris, MD  omeprazole (PRILOSEC) 20 MG capsule Take 1 capsule (20 mg total) by mouth daily. 04/05/22 05/05/22  Mardene Sayer, MD  TRI-ESTARYLLA 0.18/0.215/0.25 MG-35 MCG tablet Take 1 tablet by mouth daily. 06/28/21   [provider]    Family History Family History  Problem Relation Age of Onset   Diabetes Mother     Social History Social History   Tobacco Use   Smoking status: Some Days    Packs/day: 0.50     Years: 5.00    Additional pack years: 0.00    Total pack years: 2.50    Types: Cigarettes   Smokeless tobacco: Never  Vaping Use   Vaping Use: Never used  Substance Use Topics   Alcohol use: Not Currently   Drug use: No     Allergies   Flagyl [metronidazole] and Oysters [shellfish allergy]   Review of Systems Review of Systems  Musculoskeletal:  Positive for back pain.     Physical Exam Triage Vital Signs ED Triage Vitals  Enc Vitals Group     BP 09/04/22 1211 (!) 140/92     Pulse Rate 09/04/22 1211 72     Resp 09/04/22 1211 16     Temp 09/04/22 1211 (!) 97.4 F (36.3 C)     Temp Source 09/04/22 1211 Oral     SpO2 09/04/22 1211 98 %     Weight --      Height --  Head Circumference --      Peak Flow --      Pain Score 09/04/22 1229 5     Pain Loc --      Pain Edu? --      Excl. in [place]? --    No data found.  Updated Vital Signs BP (!) 140/92 (BP Location: Left Arm)   Pulse 72   Temp (!) 97.4 F (36.3 C) (Oral)   Resp 16   SpO2 98%   Visual Acuity Right Eye Distance:   Left Eye Distance:   Bilateral Distance:    Right Eye Near:   Left Eye Near:    Bilateral Near:     Physical Exam Vitals reviewed.  Constitutional:      General: She is not in acute distress.    Appearance: She is not toxic-appearing.  HENT:     Right Ear: Tympanic membrane and ear canal normal.     Left Ear: Tympanic membrane and ear canal normal.     Ears:     Comments: Bilaterally tympanic membrane's are gray and shiny.  There is no discharge from the canal and there is no swelling of the canal walls    Nose: Nose normal.     Mouth/Throat:     Mouth: Mucous membranes are moist.     Comments: There is mild erythema of the right side of the oropharynx with 1+ tonsillar hypertrophy there. Eyes:     Extraocular Movements: Extraocular movements intact.     Conjunctiva/sclera: Conjunctivae normal.     Pupils: Pupils are equal, round, and reactive to light.  Cardiovascular:      Rate and Rhythm: Normal rate and regular rhythm.     Heart sounds: No murmur heard. Pulmonary:     Effort: Pulmonary effort is normal. No respiratory distress.     Breath sounds: No stridor. No wheezing, rhonchi or rales.  Musculoskeletal:     Cervical back: Neck supple.  Lymphadenopathy:     Cervical: No cervical adenopathy.  Skin:    Capillary Refill: Capillary refill takes less than 2 seconds.     Coloration: Skin is not jaundiced or pale.  Neurological:     General: No focal deficit present.     Mental Status: She is alert and oriented to person, place, and time.  Psychiatric:        Behavior: Behavior normal.      UC Treatments / Results  Labs (all labs ordered are listed, but only abnormal results are displayed) Labs Reviewed  POCT URINALYSIS DIP (MANUAL ENTRY) - Abnormal; Notable for the following components:      Result Value   Bilirubin, UA small (*)    Spec Grav, UA >=1.030 (*)    All other components within normal limits  POCT RAPID STREP A (OFFICE)  POCT URINE PREGNANCY  CERVICOVAGINAL ANCILLARY ONLY  CYTOLOGY, (ORAL, ANAL, URETHRAL) ANCILLARY ONLY    EKG   Radiology No results found.  Procedures Procedures (including critical care time)  Medications Ordered in UC Medications - No data to display  Initial Impression / Assessment and Plan / UC Course  I have reviewed the triage vital signs and the nursing notes.  Pertinent labs & imaging results that were available during my care of the patient were reviewed by me and considered in my medical decision making (see chart for details).       She relates that she actually took the doxycycline last night.  Her strep test is negative,  and I am concerned that this could have caused a falsely negative strep.  Amoxicillin is sent in to treat that.  Cytology swab was done of the oropharynx, and we will notify her and treat per protocol if that is positive for anything  Self swab of the vaginal area is  also done and we will notify her if that is positive  UPT is negative  She wants to go ahead and be treated for yeast HereFinal Clinical Impressions(s) / UC Diagnoses   Final diagnoses:  Acute pharyngitis, unspecified etiology  STD exposure     Discharge Instructions      Your strep test was negative, but with your having taken the antibiotic last night, I am concerned that might be falsely negative.  Take amoxicillin 875 mg--1 tab twice daily for 10 days;  Your pregnancy test was negative  Your urinalysis showed that you need to drink more water  Staff will notify you if there is anything positive on the throat swab or on your vaginal swab.      ED Prescriptions     Medication Sig Dispense Auth. Provider   amoxicillin (AMOXIL) 875 MG tablet Take 1 tablet (875 mg total) by mouth 2 (two) times daily for 10 days. 20 tablet Zenia Resides, MD   fluconazole (DIFLUCAN) 150 MG tablet Take 1 tablet (150 mg total) by mouth every 3 (three) days for 2 doses. 2 tablet Marlinda Mike Janace Aris, MD      PDMP not reviewed this encounter.   Zenia Resides, MD 09/04/22 1254

## 2022-09-04 NOTE — Discharge Instructions (Signed)
Your strep test was negative, but with your having taken the antibiotic last night, I am concerned that might be falsely negative.  Take amoxicillin 875 mg--1 tab twice daily for 10 days;  Your pregnancy test was negative  Your urinalysis showed that you need to drink more water  Staff will notify you if there is anything positive on the throat swab or on your vaginal swab.

## 2022-09-04 NOTE — ED Triage Notes (Signed)
Pt here for sore throat and right ear pain; pt sts had oral sex 2 days unsure if related; pt sts some lower back pain and also requests testing for STD and UTI

## 2022-09-05 LAB — CERVICOVAGINAL ANCILLARY ONLY
Bacterial Vaginitis (gardnerella): NEGATIVE
Candida Glabrata: NEGATIVE
Candida Vaginitis: POSITIVE — AB
Chlamydia: NEGATIVE
Comment: NEGATIVE
Comment: NEGATIVE
Comment: NEGATIVE
Comment: NEGATIVE
Comment: NEGATIVE
Comment: NORMAL
Neisseria Gonorrhea: NEGATIVE
Trichomonas: NEGATIVE

## 2022-09-05 LAB — CYTOLOGY, (ORAL, ANAL, URETHRAL) ANCILLARY ONLY
Chlamydia: NEGATIVE
Comment: NEGATIVE
Comment: NEGATIVE
Comment: NORMAL
Neisseria Gonorrhea: NEGATIVE
Trichomonas: NEGATIVE

## 2022-09-10 ENCOUNTER — Telehealth (HOSPITAL_COMMUNITY): Payer: Self-pay | Admitting: Emergency Medicine

## 2022-09-10 MED ORDER — FLUCONAZOLE 150 MG PO TABS: 150.0000 mg | ORAL_TABLET | Freq: Once | ORAL | 0 refills | Status: AC

## 2022-09-10 NOTE — Telephone Encounter (Signed)
Patient requested antibiotic associated yeast medicine.  Sent, per protocol Reviewed with patient, verified pharmacy, prescription sent

## 2022-11-07 ENCOUNTER — Ambulatory Visit
Admission: EM | Admit: 2022-11-07 | Discharge: 2022-11-07 | Disposition: A | Discharge status: Home / Self Care | Payer: Medicaid Other | Attending: Emergency Medicine | Admitting: Emergency Medicine

## 2022-11-07 DIAGNOSIS — N898 Other specified noninflammatory disorders of vagina: Secondary | ICD-10-CM | POA: Insufficient documentation

## 2022-11-07 LAB — POCT URINALYSIS DIP (MANUAL ENTRY)
Glucose, UA: NEGATIVE mg/dL
Ketones, POC UA: NEGATIVE mg/dL
Leukocytes, UA: NEGATIVE
Nitrite, UA: NEGATIVE
Protein Ur, POC: NEGATIVE mg/dL
Spec Grav, UA: >=1.03 — AB (ref 1.010–1.025)
Urobilinogen, UA: 0.2 E.U./dL
pH, UA: 5.5 (ref 5.0–8.0)

## 2022-11-07 MED ORDER — FLUCONAZOLE 150 MG PO TABS: ORAL_TABLET | ORAL | 0 refills | Status: DC

## 2022-11-07 MED ORDER — METRONIDAZOLE 0.75 % VA GEL: 1.0000 | Freq: Every day | VAGINAL | 0 refills | Status: DC

## 2022-11-07 NOTE — ED Provider Notes (Signed)
[name]    CSN: [id number] Arrival date & time: 11/07/22  1522      History   Chief Complaint Chief Complaint  Patient presents with   Vaginitis    HPI Barbara Zimmerman is a 32 y.o. female.   As for evaluation of vaginal irritation, itching and foul odor present for 3 days.  Possible urinary frequency but does endorse that she drinks caffeine and increased amounts of water daily.  Attempted use of old gel, has seen slight improvement in symptoms.  Sexually active, denies known exposure.  Denies genital discharge, abdominal pain, flank pain, hematuria,   Past Medical History:  Diagnosis Date   Asthma    childhood   Post partum depression    Tonsillitis     There are no problems to display for this patient.   Past Surgical History:  Procedure Laterality Date   ABDOMINAL SURGERY     c secion     CESAREAN SECTION      OB History     Gravida  3   Para  1   Term      Preterm  1   AB  2   Living  2      SAB  1   IAB  1   Ectopic      Multiple  1   Live Births  2            Home Medications    Prior to Admission medications   Medication Sig Start Date End Date Taking? Authorizing Provider  omeprazole (PRILOSEC) 20 MG capsule Take 1 capsule (20 mg total) by mouth daily. 04/05/22 11/07/22 Yes Mardene Sayer, MD  TRI-ESTARYLLA 0.18/0.215/0.25 MG-35 MCG tablet Take 1 tablet by mouth daily. 06/28/21  Yes [provider]    Family History Family History  Problem Relation Age of Onset   Diabetes Mother     Social History Social History   Tobacco Use   Smoking status: Some Days    Packs/day: 0.50    Years: 5.00    Additional pack years: 0.00    Total pack years: 2.50    Types: Cigarettes   Smokeless tobacco: Never  Vaping Use   Vaping Use: Never used  Substance Use Topics   Alcohol use: Not Currently   Drug use: No     Allergies   Flagyl [metronidazole] and Oysters [shellfish allergy]   Review of  Systems Review of Systems  Constitutional: Negative.   Respiratory: Negative.    Cardiovascular: Negative.   Genitourinary:  Positive for vaginal pain. Negative for decreased urine volume, difficulty urinating, dyspareunia, dysuria, enuresis, flank pain, frequency, genital sores, hematuria, menstrual problem, pelvic pain, urgency, vaginal bleeding and vaginal discharge.     Physical Exam Triage Vital Signs ED Triage Vitals  Enc Vitals Group     BP 11/07/22 1600 104/71     Pulse Rate 11/07/22 1600 70     Resp 11/07/22 1600 18     Temp 11/07/22 1600 98.2 F (36.8 C)     Temp Source 11/07/22 1600 Oral     SpO2 11/07/22 1600 98 %     Weight 11/07/22 1600 215 lb (97.5 kg)     Height 11/07/22 1600 5\' 6"  (1.676 m)     Head Circumference --      Peak Flow --      Pain Score 11/07/22 1559 0     Pain Loc --      Pain  Edu? --      Excl. in GC? --    No data found.  Updated Vital Signs BP 104/71 (BP Location: Left Arm)   Pulse 70   Temp 98.2 F (36.8 C) (Oral)   Resp 18   Ht 5\' 6"  (1.676 m)   Wt 215 lb (97.5 kg)   LMP 10/07/2022 (Approximate)   SpO2 98%   BMI 34.70 kg/m   Visual Acuity Right Eye Distance:   Left Eye Distance:   Bilateral Distance:    Right Eye Near:   Left Eye Near:    Bilateral Near:     Physical Exam Constitutional:      Appearance: Normal appearance.  Eyes:     Extraocular Movements: Extraocular movements intact.  Pulmonary:     Effort: Pulmonary effort is normal.  Genitourinary:    Comments: deferred Skin:    General: Skin is warm and dry.  Neurological:     Mental Status: She is alert and oriented to person, place, and time. Mental status is at baseline.      UC Treatments / Results  Labs (all labs ordered are listed, but only abnormal results are displayed) Labs Reviewed  POCT URINALYSIS DIP (MANUAL ENTRY) - Abnormal; Notable for the following components:      Result Value   Bilirubin, UA small (*)    Spec Grav, UA >=1.030 (*)     Blood, UA trace-intact (*)    All other components within normal limits  CERVICOVAGINAL ANCILLARY ONLY    EKG   Radiology No results found.  Procedures Procedures (including critical care time)  Medications Ordered in UC Medications - No data to display  Initial Impression / Assessment and Plan / UC Course  I have reviewed the triage vital signs and the nursing notes.  Pertinent labs & imaging results that were available during my care of the patient were reviewed by me and considered in my medical decision making (see chart for details).   Vaginal irritation  Treated prophylactically for BV and yeast, MetroGel and Diflucan sent to pharmacy, discussed administration, urinalysis is negative, discussed findings,  STI labs pending will treat per protocol, advised abstinence until lab results, and/or treatment is complete, advised condom use during all sexual encounters moving, may follow-up with urgent care as needed  Final Clinical Impressions(s) / UC Diagnoses   Final diagnoses:  None     Discharge Instructions      Today you are being treated prophylactically for yeast.   Urinalysis negative   Take diflucan 150 mg once, if symptoms still present in 3 days then you may take second pill   Yeast infections which are caused by a naturally occurring fungus called candida. Vaginosis is an inflammation of the vagina that can result in discharge, itching and pain. The cause is usually a change in the normal balance of vaginal bacteria or an infection. Vaginosis can also result from reduced estrogen levels after menopause.  Labs pending 2-3 days, you will be contacted if positive for any sti and treatment will be sent to the pharmacy, you will have to return to the clinic if positive for gonorrhea to receive treatment   Please refrain from having sex until labs results, if positive please refrain from having sex until treatment complete and symptoms resolve   If positive  for, Chlamydia  gonorrhea or trichomoniasis please notify partner or partners so they may tested as well  Moving forward, it is recommended you use some form of protection  against the transmission of sti infections  such as condoms or dental dams with each sexual encounter    In addition:   Avoid baths, hot tubs and whirlpool spas.  Don't use scented or harsh soaps, such as those with deodorant or antibacterial action. Avoid irritants. These include scented tampons and pads. Wipe from front to back after using the toilet.  Don't douche. Your vagina doesn't require cleansing other than normal bathing.  Use a  condom. Wear cotton underwear, this fabric helps absorb moisture     ED Prescriptions   None    PDMP not reviewed this encounter.   Valinda Hoar, NP 11/07/22 1635

## 2022-11-07 NOTE — Discharge Instructions (Addendum)
Today you are being treated prophylactically for yeast and bacterial vaginosis  Urinalysis negative   Take diflucan 150 mg once, take second tablet. on day 8 after completion of antibiotics  Insert vaginal MetroGel every night before bed for 7 days  Labs pending , you will be contacted if positive for any sti and treatment will be sent to the pharmacy, you will have to return to the clinic if positive for gonorrhea to receive treatment   Please refrain from having sex until labs results, if positive please refrain from having sex until treatment complete and symptoms resolve   If positive for, Chlamydia  gonorrhea or trichomoniasis please notify partner or partners so they may tested as well  Moving forward, it is recommended you use some form of protection against the transmission of sti infections  such as condoms or dental dams with each sexual encounter    In addition:   Avoid baths, hot tubs and whirlpool spas.  Don't use scented or harsh soaps, such as those with deodorant or antibacterial action. Avoid irritants. These include scented tampons and pads. Wipe from front to back after using the toilet.  Don't douche. Your vagina doesn't require cleansing other than normal bathing.  Use a  condom. Wear cotton underwear, this fabric helps absorb moisture

## 2022-11-07 NOTE — ED Triage Notes (Signed)
Patient here today with c/o vaginal irritation X 3 days. She has tried some BV cream that she had from previous episode yesterday and the day before. She did not have any relief.

## 2022-11-09 LAB — CERVICOVAGINAL ANCILLARY ONLY
Bacterial Vaginitis (gardnerella): NEGATIVE
Candida Glabrata: NEGATIVE
Candida Vaginitis: NEGATIVE
Chlamydia: NEGATIVE
Comment: NEGATIVE
Comment: NEGATIVE
Comment: NEGATIVE
Comment: NEGATIVE
Comment: NEGATIVE
Comment: NORMAL
Neisseria Gonorrhea: NEGATIVE
Trichomonas: NEGATIVE

## 2023-01-07 ENCOUNTER — Other Ambulatory Visit: Payer: Self-pay

## 2023-01-07 ENCOUNTER — Ambulatory Visit
Admission: RE | Admit: 2023-01-07 | Discharge: 2023-01-07 | Disposition: A | Discharge status: Home / Self Care | Payer: Medicaid Other | Source: Ambulatory Visit | Attending: Physician Assistant | Admitting: Physician Assistant

## 2023-01-07 VITALS — BP 117/75 | HR 60 | Temp 98.1°F | Resp 16

## 2023-01-07 DIAGNOSIS — Z113 Encounter for screening for infections with a predominantly sexual mode of transmission: Secondary | ICD-10-CM | POA: Diagnosis present

## 2023-01-07 DIAGNOSIS — Z1152 Encounter for screening for COVID-19: Secondary | ICD-10-CM | POA: Diagnosis not present

## 2023-01-07 DIAGNOSIS — F17210 Nicotine dependence, cigarettes, uncomplicated: Secondary | ICD-10-CM | POA: Diagnosis not present

## 2023-01-07 DIAGNOSIS — J069 Acute upper respiratory infection, unspecified: Secondary | ICD-10-CM | POA: Diagnosis not present

## 2023-01-07 DIAGNOSIS — N898 Other specified noninflammatory disorders of vagina: Secondary | ICD-10-CM | POA: Diagnosis not present

## 2023-01-07 LAB — POCT URINALYSIS DIP (MANUAL ENTRY)
Bilirubin, UA: NEGATIVE
Blood, UA: NEGATIVE
Glucose, UA: NEGATIVE mg/dL
Ketones, POC UA: NEGATIVE mg/dL
Leukocytes, UA: NEGATIVE
Nitrite, UA: NEGATIVE
Protein Ur, POC: NEGATIVE mg/dL
Spec Grav, UA: 1.025 (ref 1.010–1.025)
Urobilinogen, UA: 0.2 E.U./dL
pH, UA: 6 (ref 5.0–8.0)

## 2023-01-07 LAB — POCT RAPID STREP A (OFFICE): Rapid Strep A Screen: NEGATIVE

## 2023-01-07 NOTE — ED Provider Notes (Signed)
[name]    CSN: [id number] Arrival date & time: 01/07/23  1056      History   Chief Complaint Chief Complaint  Patient presents with   Vaginal Discharge    Entered by patient    HPI VIRDIA SABATER is a 32 y.o. female.   Patient here today with multiple concerns.  She states that she has had some vaginal discharge and would like STD screening including blood work.  She also would like to have her urine checked to rule out UTI.  She states she has had some sore throat, right ear pain, congestion and bodyaches.  She would like screening for strep and COVID.  She denies any vomiting or diarrhea.  The history is provided by the patient.  Vaginal Discharge Associated symptoms: no fever, no nausea and no vomiting     Past Medical History:  Diagnosis Date   Asthma    childhood   Post partum depression    Tonsillitis     There are no problems to display for this patient.   Past Surgical History:  Procedure Laterality Date   ABDOMINAL SURGERY     c secion     CESAREAN SECTION      OB History     Gravida  3   Para  1   Term      Preterm  1   AB  2   Living  2      SAB  1   IAB  1   Ectopic      Multiple  1   Live Births  2            Home Medications    Prior to Admission medications   Medication Sig Start Date End Date Taking? Authorizing Provider  fluconazole (DIFLUCAN) 150 MG tablet Take 1 tablet of day 1, then take 1 tablet on day 8 11/07/22   White, Adrienne R, NP  metroNIDAZOLE (METROGEL) 0.75 % vaginal gel Place 1 Applicatorful vaginally at bedtime. 11/07/22   White, Elita Boone, NP  omeprazole (PRILOSEC) 20 MG capsule Take 1 capsule (20 mg total) by mouth daily. 04/05/22 11/07/22  Mardene Sayer, MD  TRI-ESTARYLLA 0.18/0.215/0.25 MG-35 MCG tablet Take 1 tablet by mouth daily. 06/28/21   [provider]    Family History Family History  Problem Relation Age of Onset   Diabetes Mother     Social  History Social History   Tobacco Use   Smoking status: Some Days    Current packs/day: 0.50    Average packs/day: 0.5 packs/day for 5.0 years (2.5 ttl pk-yrs)    Types: Cigarettes   Smokeless tobacco: Never  Vaping Use   Vaping status: Never Used  Substance Use Topics   Alcohol use: Not Currently   Drug use: No     Allergies   Flagyl [metronidazole] and Oysters [shellfish allergy]   Review of Systems Review of Systems  Constitutional:  Negative for chills and fever.  HENT:  Positive for congestion, ear pain and sore throat.   Eyes:  Negative for discharge and redness.  Respiratory:  Positive for cough. Negative for shortness of breath and wheezing.   Gastrointestinal:  Negative for diarrhea, nausea and vomiting.  Genitourinary:  Positive for vaginal discharge.     Physical Exam Triage Vital Signs ED Triage Vitals  Encounter Vitals Group     BP 01/07/23 1128 117/75     Systolic BP Percentile --  Diastolic BP Percentile --      Pulse Rate 01/07/23 1128 60     Resp 01/07/23 1128 16     Temp 01/07/23 1128 98.1 F (36.7 C)     Temp Source 01/07/23 1128 Oral     SpO2 01/07/23 1128 96 %     Weight --      Height --      Head Circumference --      Peak Flow --      Pain Score 01/07/23 1132 5     Pain Loc --      Pain Education --      Exclude from Growth Chart --    No data found.  Updated Vital Signs BP 117/75 (BP Location: Left Arm)   Pulse 60   Temp 98.1 F (36.7 C) (Oral)   Resp 16   LMP 12/24/2022   SpO2 96%      Physical Exam Vitals and nursing note reviewed.  Constitutional:      General: She is not in acute distress.    Appearance: Normal appearance. She is not ill-appearing.  HENT:     Head: Normocephalic and atraumatic.     Right Ear: Tympanic membrane normal.     Left Ear: Tympanic membrane normal.     Nose: Congestion present.     Mouth/Throat:     Mouth: Mucous membranes are moist.     Pharynx: No oropharyngeal exudate or  posterior oropharyngeal erythema.  Eyes:     Conjunctiva/sclera: Conjunctivae normal.  Cardiovascular:     Rate and Rhythm: Normal rate and regular rhythm.     Heart sounds: Normal heart sounds. No murmur heard. Pulmonary:     Effort: Pulmonary effort is normal. No respiratory distress.     Breath sounds: Normal breath sounds. No wheezing, rhonchi or rales.  Genitourinary:    Comments: Self swab performed Skin:    General: Skin is warm and dry.  Neurological:     Mental Status: She is alert.  Psychiatric:        Mood and Affect: Mood normal.        Thought Content: Thought content normal.      UC Treatments / Results  Labs (all labs ordered are listed, but only abnormal results are displayed) Labs Reviewed  SARS CORONAVIRUS 2 (TAT 6-24 HRS)  RPR  HIV ANTIBODY (ROUTINE TESTING W REFLEX)  HEPATITIS PANEL, ACUTE  POCT URINALYSIS DIP (MANUAL ENTRY)  POCT RAPID STREP A (OFFICE)  CERVICOVAGINAL ANCILLARY ONLY    EKG   Radiology No results found.  Procedures Procedures (including critical care time)  Medications Ordered in UC Medications - No data to display  Initial Impression / Assessment and Plan / UC Course  I have reviewed the triage vital signs and the nursing notes.  Pertinent labs & imaging results that were available during my care of the patient were reviewed by me and considered in my medical decision making (see chart for details).    Multiple labs ordered as requested.  Strep screening negative in office.  UA without concerning findings.  Suspect likely viral etiology of upper respiratory symptoms and will screen for COVID.  Will await results further recommendation.  STD screening pending.  Encouraged follow-up with any further concerns.  Final Clinical Impressions(s) / UC Diagnoses   Final diagnoses:  Screening examination for STD (sexually transmitted disease)  Acute upper respiratory infection  Vaginal discharge   Discharge Instructions    None    ED  Prescriptions   None    PDMP not reviewed this encounter.   Tomi Bamberger, PA-C 01/07/23 1239

## 2023-01-07 NOTE — ED Triage Notes (Addendum)
Cold chills, itchy throat, and right ear pain x 4 days. Requesting strep test and covid test Vaginal discharge x 3 days, requesting STD (including bloodwork) and UTI rule out. Denies abdominal pain, fever, dysuria, but still requesting to be checked for UTI. Reports use of oral contraceptives, LMP 2 weeks ago, denies wanting pregnancy test.

## 2023-01-08 ENCOUNTER — Emergency Department (HOSPITAL_BASED_OUTPATIENT_CLINIC_OR_DEPARTMENT_OTHER): Payer: Medicaid Other | Admitting: Radiology

## 2023-01-08 ENCOUNTER — Other Ambulatory Visit: Payer: Self-pay

## 2023-01-08 ENCOUNTER — Emergency Department (HOSPITAL_BASED_OUTPATIENT_CLINIC_OR_DEPARTMENT_OTHER)
Admission: EM | Admit: 2023-01-08 | Discharge: 2023-01-08 | Disposition: A | Discharge status: Home / Self Care | Payer: Medicaid Other | Attending: Emergency Medicine | Admitting: Emergency Medicine

## 2023-01-08 DIAGNOSIS — F17200 Nicotine dependence, unspecified, uncomplicated: Secondary | ICD-10-CM | POA: Diagnosis not present

## 2023-01-08 DIAGNOSIS — J45909 Unspecified asthma, uncomplicated: Secondary | ICD-10-CM | POA: Diagnosis not present

## 2023-01-08 DIAGNOSIS — J40 Bronchitis, not specified as acute or chronic: Secondary | ICD-10-CM | POA: Diagnosis not present

## 2023-01-08 DIAGNOSIS — R0602 Shortness of breath: Secondary | ICD-10-CM | POA: Diagnosis present

## 2023-01-08 LAB — BASIC METABOLIC PANEL
Anion gap: 7 (ref 5–15)
BUN: 19 mg/dL (ref 6–20)
CO2: 27 mmol/L (ref 22–32)
Calcium: 9.8 mg/dL (ref 8.9–10.3)
Chloride: 106 mmol/L (ref 98–111)
Creatinine, Ser: 0.71 mg/dL (ref 0.44–1.00)
GFR, Estimated: >60 mL/min (ref >60)
Glucose, Bld: 83 mg/dL (ref 70–99)
Potassium: 4.3 mmol/L (ref 3.5–5.1)
Sodium: 140 mmol/L (ref 135–145)

## 2023-01-08 LAB — TROPONIN I (HIGH SENSITIVITY): Troponin I (High Sensitivity): <2 ng/L (ref <18)

## 2023-01-08 LAB — CBC
HCT: 43.3 % (ref 36.0–46.0)
Hemoglobin: 13.9 g/dL (ref 12.0–15.0)
MCH: 28.5 pg (ref 26.0–34.0)
MCHC: 32.1 g/dL (ref 30.0–36.0)
MCV: 88.9 fL (ref 80.0–100.0)
Platelets: 320 10*3/uL (ref 150–400)
RBC: 4.87 MIL/uL (ref 3.87–5.11)
RDW: 13.9 % (ref 11.5–15.5)
WBC: 5.4 10*3/uL (ref 4.0–10.5)
nRBC: 0 % (ref 0.0–0.2)

## 2023-01-08 LAB — PREGNANCY, URINE: Preg Test, Ur: NEGATIVE

## 2023-01-08 MED ORDER — DEXAMETHASONE 4 MG PO TABS
10.0000 mg | ORAL_TABLET | Freq: Once | ORAL | Status: AC
  Administered 2023-01-08: 10 mg via ORAL
  Filled 2023-01-08: qty 3

## 2023-01-08 MED ORDER — ALBUTEROL SULFATE HFA 108 (90 BASE) MCG/ACT IN AERS
2.0000 | INHALATION_SPRAY | Freq: Once | RESPIRATORY_TRACT | Status: AC
  Administered 2023-01-08: 2 via RESPIRATORY_TRACT
  Filled 2023-01-08: qty 6.7

## 2023-01-08 NOTE — ED Triage Notes (Signed)
Pt presents to the er with SOB, wheezing, chest tightness and blurred visions. Pt stated the symptoms started a few weeks ago, but symptoms have increased.

## 2023-01-08 NOTE — Discharge Instructions (Signed)
All your blood work and x-rays look normal today.  Use the inhaler every 4-6 hours as needed for wheezing.  You were given a dose of steroids here which should last for the next 3 days.

## 2023-01-08 NOTE — ED Provider Notes (Signed)
EMERGENCY DEPARTMENT AT [facility] Provider Note   CSN: [id number] Arrival date & time: 01/08/23  1638     History  No chief complaint on file.   Barbara Zimmerman is a 32 y.o. female.  Patient is a 32 year old female with a history of asthma but no longer needs inhalers, tobacco use who is presenting today with a 1 week history that initially started with chills and feeling generally unwell with myalgias that has progressed in the last 2 days to intermittent wheezing, shortness of breath and pleuritic chest pain.  No significant cough or nasal congestion.  She was seen at urgent care yesterday and had a negative strep and COVID testing.  No nausea vomiting or diarrhea.  She denies any abdominal pain.  The history is provided by the patient.       Home Medications Prior to Admission medications   Medication Sig Start Date End Date Taking? Authorizing Provider  fluconazole (DIFLUCAN) 150 MG tablet Take 1 tablet of day 1, then take 1 tablet on day 8 11/07/22   White, Adrienne R, NP  metroNIDAZOLE (METROGEL) 0.75 % vaginal gel Place 1 Applicatorful vaginally at bedtime. 11/07/22   White, Elita Boone, NP  omeprazole (PRILOSEC) 20 MG capsule Take 1 capsule (20 mg total) by mouth daily. 04/05/22 11/07/22  Mardene Sayer, MD  TRI-ESTARYLLA 0.18/0.215/0.25 MG-35 MCG tablet Take 1 tablet by mouth daily. 06/28/21   [provider]      Allergies    Flagyl [metronidazole] and Laqueta Jean allergy]    Review of Systems   Review of Systems  Physical Exam Updated Vital Signs BP 120/77   Pulse 69   Temp 98 F (36.7 C) (Oral)   Resp 16   LMP 12/24/2022   SpO2 96%  Physical Exam Vitals and nursing note reviewed.  Constitutional:      General: She is not in acute distress.    Appearance: She is well-developed.  HENT:     Head: Normocephalic and atraumatic.     Right Ear: Tympanic membrane normal.     Left Ear: Tympanic membrane normal.  Eyes:      Pupils: Pupils are equal, round, and reactive to light.  Cardiovascular:     Rate and Rhythm: Normal rate and regular rhythm.     Heart sounds: Normal heart sounds. No murmur heard.    No friction rub.  Pulmonary:     Effort: Pulmonary effort is normal.     Breath sounds: Wheezing present. No rales.     Comments: Scant wheezing Abdominal:     General: Bowel sounds are normal. There is no distension.     Palpations: Abdomen is soft.     Tenderness: There is no abdominal tenderness. There is no guarding or rebound.  Musculoskeletal:        General: No tenderness. Normal range of motion.     Comments: No edema  Skin:    General: Skin is warm and dry.     Findings: No rash.  Neurological:     Mental Status: She is alert and oriented to person, place, and time. Mental status is at baseline.     Cranial Nerves: No cranial nerve deficit.  Psychiatric:        Behavior: Behavior normal.     ED Results / Procedures / Treatments   Labs (all labs ordered are listed, but only abnormal results are displayed) Labs Reviewed  BASIC METABOLIC PANEL  CBC  PREGNANCY, URINE  TROPONIN I (HIGH SENSITIVITY)    EKG EKG Interpretation Date/Time:  Tuesday January 08 2023 17:12:39 EDT Ventricular Rate:  64 PR Interval:  162 QRS Duration:  80 QT Interval:  424 QTC Calculation: 437 R Axis:   32  Text Interpretation: Normal sinus rhythm No previous ECGs available Confirmed by Gwyneth Sprout ([id number]) on 01/08/2023 8:53:14 PM  Radiology DG Chest 2 View  Result Date: 01/08/2023 CLINICAL DATA:  Chest pain and tightness EXAM: CHEST - 2 VIEW COMPARISON:  X-ray 02/09/2008 FINDINGS: No consolidation, pneumothorax or effusion. No edema. Normal cardiopericardial silhouette. IMPRESSION: No acute cardiopulmonary disease. Electronically Signed   By: Karen Kays M.D.   On: 01/08/2023 18:15    Procedures Procedures    Medications Ordered in ED Medications  albuterol (VENTOLIN HFA) 108 (90 Base)  MCG/ACT inhaler 2 puff (2 puffs Inhalation Given 01/08/23 2123)  dexamethasone (DECADRON) tablet 10 mg (10 mg Oral Given 01/08/23 2121)    ED Course/ Medical Decision Making/ A&P                                 Medical Decision Making Amount and/or Complexity of Data Reviewed External Data Reviewed: notes. Labs: ordered. Decision-making details documented in ED Course. Radiology: ordered and independent interpretation performed. Decision-making details documented in ED Course. ECG/medicine tests: ordered and independent interpretation performed. Decision-making details documented in ED Course.  Risk Prescription drug management.   Pt with symptoms consistent with viral URI with bronchitis vs asthma exacerbation.  Well appearing here.  No signs of breathing difficulty but scant wheezing.  No signs of pharyngitis, otitis or abnormal abdominal findings.   I have independently visualized and interpreted pt's images today. CXR wnl.  I independently interpreted patient's EKG and labs.  EKG without acute findings, labs are unremarkable.  And pt to return with any further problems.  She was given an inhaler and Decadron here.         Final Clinical Impression(s) / ED Diagnoses Final diagnoses:  Bronchitis    Rx / DC Orders ED Discharge Orders     None         Gwyneth Sprout, MD 01/08/23 2252

## 2023-01-10 LAB — ACUTE VIRAL HEPATITIS (HAV, HBV, HCV)
HCV Ab: NONREACTIVE
Hep A IgM: NEGATIVE
Hep B C IgM: NEGATIVE
Hepatitis B Surface Ag: NEGATIVE

## 2023-01-10 LAB — HCV INTERPRETATION

## 2023-01-10 LAB — SPECIMEN STATUS REPORT

## 2023-06-06 ENCOUNTER — Ambulatory Visit
Admission: EM | Admit: 2023-06-06 | Discharge: 2023-06-06 | Disposition: A | Discharge status: Home / Self Care | Payer: Medicaid Other | Attending: Family Medicine | Admitting: Family Medicine

## 2023-06-06 DIAGNOSIS — R300 Dysuria: Secondary | ICD-10-CM

## 2023-06-06 DIAGNOSIS — Z113 Encounter for screening for infections with a predominantly sexual mode of transmission: Secondary | ICD-10-CM | POA: Insufficient documentation

## 2023-06-06 DIAGNOSIS — N898 Other specified noninflammatory disorders of vagina: Secondary | ICD-10-CM | POA: Diagnosis present

## 2023-06-06 DIAGNOSIS — R102 Pelvic and perineal pain: Secondary | ICD-10-CM

## 2023-06-06 LAB — POCT URINALYSIS DIP (MANUAL ENTRY)
Bilirubin, UA: NEGATIVE
Glucose, UA: NEGATIVE mg/dL
Ketones, POC UA: NEGATIVE mg/dL
Leukocytes, UA: NEGATIVE
Nitrite, UA: NEGATIVE
Spec Grav, UA: >=1.03 — AB (ref 1.010–1.025)
Urobilinogen, UA: 0.2 U/dL
pH, UA: 6 (ref 5.0–8.0)

## 2023-06-06 MED ORDER — PHENAZOPYRIDINE HCL 100 MG PO TABS: 100.0000 mg | ORAL_TABLET | Freq: Three times a day (TID) | ORAL | 0 refills | Status: DC | PRN

## 2023-06-06 NOTE — ED Triage Notes (Signed)
 I believe I have a UTI and want to get checked for STI's as well. Current symptoms severe stomach pain, history of gastritis and GERD. Abd pain extends to right flank and right lower back. A lot of urinary urgency. I did do 2 days of Ciprofloxacin that I had left over. Some vaginal discharge so I need BV and STI testing.

## 2023-06-06 NOTE — ED Provider Notes (Signed)
 [facility]    CSN: [id number] Arrival date & time: 06/06/23  1440      History   Chief Complaint Chief Complaint  Patient presents with   UTI Symptoms    HPI Barbara Zimmerman is a 33 y.o. female.   Patient presenting today with several day history of lower abdominal pain sometimes radiating to the right lower back, vaginal discharge, urinary urgency and frequency.  She states this was happening a bit last week or the week before and she had to days of Cipro at home that she took but this did not significantly help symptoms.  Otherwise not try anything over-the-counter for symptoms.  Denies fever, chills, nausea, vomiting, bowel changes, rashes or lesions.  Requesting STI testing and testing for BV as well.    Past Medical History:  Diagnosis Date   Asthma    childhood   Post partum depression    Tonsillitis     There are no active problems to display for this patient.   Past Surgical History:  Procedure Laterality Date   ABDOMINAL SURGERY     c secion     CESAREAN SECTION      OB History     Gravida  3   Para  1   Term      Preterm  1   AB  2   Living  2      SAB  1   IAB  1   Ectopic      Multiple  1   Live Births  2            Home Medications    Prior to Admission medications   Medication Sig Start Date End Date Taking? Authorizing Provider  Azelastine HCl 137 MCG/SPRAY SOLN Place 2 sprays into the nose as directed. 02/22/21  Yes [provider]  ibuprofen  (ADVIL ) 800 MG tablet Take 800 mg by mouth every 6 (six) hours as needed for mild pain (pain score 1-3). 1/2 taken. 06/03/23 06/10/23 Yes [provider]  omeprazole  (PRILOSEC) 20 MG capsule Take 20 mg by mouth daily. 11/27/22  Yes [provider]  phenazopyridine  (PYRIDIUM ) 100 MG tablet Take 1 tablet (100 mg total) by mouth 3 (three) times daily as needed for pain. 06/06/23  Yes Stuart Vernell Norris, PA-C  polyethylene glycol (MIRALAX  / GLYCOLAX ) 17  g packet Take 17 g by mouth daily. 06/13/22  Yes [provider]  promethazine -dextromethorphan (PROMETHAZINE -DM) 6.25-15 MG/5ML syrup Take 5 mLs by mouth 4 (four) times daily as needed. 06/30/21  Yes [provider]  TRI-ESTARYLLA  0.18/0.215/0.25 MG-35 MCG tablet Take 1 tablet by mouth daily. 06/28/21  Yes [provider]  fluconazole  (DIFLUCAN ) 150 MG tablet Take 1 tablet of day 1, then take 1 tablet on day 8 11/07/22   White, Adrienne R, NP  metroNIDAZOLE  (METROGEL ) 0.75 % vaginal gel Place 1 Applicatorful vaginally at bedtime. 11/07/22   White, Shelba SAUNDERS, NP  omeprazole  (PRILOSEC) 20 MG capsule Take 1 capsule (20 mg total) by mouth daily. 04/05/22 11/07/22  Ethyl Richerd BROCKS, MD    Family History Family History  Problem Relation Age of Onset   Diabetes Mother     Social History Social History   Tobacco Use   Smoking status: Some Days    Current packs/day: 0.50    Average packs/day: 0.5 packs/day for 5.0 years (2.5 ttl pk-yrs)    Types: Cigarettes   Smokeless tobacco: Never  Vaping Use   Vaping status:  Never Used  Substance Use Topics   Alcohol use: Not Currently   Drug use: No     Allergies   Penicillins, Shellfish allergy, and Metronidazole    Review of Systems Review of Systems Per HPI  Physical Exam Triage Vital Signs ED Triage Vitals  Encounter Vitals Group     BP 06/06/23 1500 109/75     Systolic BP Percentile --      Diastolic BP Percentile --      Pulse Rate 06/06/23 1500 67     Resp 06/06/23 1500 18     Temp 06/06/23 1500 98 F (36.7 C)     Temp Source 06/06/23 1500 Oral     SpO2 06/06/23 1500 98 %     Weight 06/06/23 1455 237 lb 14.4 oz (107.9 kg)     Height 06/06/23 1455 5' 6 (1.676 m)     Head Circumference --      Peak Flow --      Pain Score 06/06/23 1450 7     Pain Loc --      Pain Education --      Exclude from Growth Chart --    No data found.  Updated Vital Signs BP 109/75 (BP Location: Left Arm)   Pulse 67    Temp 98 F (36.7 C) (Oral)   Resp 18   Ht 5' 6 (1.676 m)   Wt 237 lb 14.4 oz (107.9 kg)   LMP 05/20/2023 (Exact Date)   SpO2 98%   BMI 38.40 kg/m   Visual Acuity Right Eye Distance:   Left Eye Distance:   Bilateral Distance:    Right Eye Near:   Left Eye Near:    Bilateral Near:     Physical Exam Vitals and nursing note reviewed.  Constitutional:      Appearance: Normal appearance. She is not ill-appearing.  HENT:     Head: Atraumatic.  Eyes:     Extraocular Movements: Extraocular movements intact.     Conjunctiva/sclera: Conjunctivae normal.  Cardiovascular:     Rate and Rhythm: Normal rate and regular rhythm.     Heart sounds: Normal heart sounds.  Pulmonary:     Effort: Pulmonary effort is normal.     Breath sounds: Normal breath sounds.  Abdominal:     General: Bowel sounds are normal. There is no distension.     Palpations: Abdomen is soft.     Tenderness: There is no abdominal tenderness. There is no right CVA tenderness, left CVA tenderness or guarding.  Genitourinary:    Comments: GU exam deferred, self swab performed Musculoskeletal:        General: Normal range of motion.     Cervical back: Normal range of motion and neck supple.  Skin:    General: Skin is warm and dry.  Neurological:     Mental Status: She is alert and oriented to person, place, and time.  Psychiatric:        Mood and Affect: Mood normal.        Thought Content: Thought content normal.        Judgment: Judgment normal.      UC Treatments / Results  Labs (all labs ordered are listed, but only abnormal results are displayed) Labs Reviewed  POCT URINALYSIS DIP (MANUAL ENTRY) - Abnormal; Notable for the following components:      Result Value   Color, UA other (*)    Spec Grav, UA >=1.030 (*)    Blood, UA trace-intact (*)  Protein Ur, POC trace (*)    All other components within normal limits  URINE CULTURE  CERVICOVAGINAL ANCILLARY ONLY    EKG   Radiology No results  found.  Procedures Procedures (including critical care time)  Medications Ordered in UC Medications - No data to display  Initial Impression / Assessment and Plan / UC Course  I have reviewed the triage vital signs and the nursing notes.  Pertinent labs & imaging results that were available during my care of the patient were reviewed by me and considered in my medical decision making (see chart for details).     No evidence of urinary tract infection today but urine culture pending for further rule out as she is concerned about a UTI.  Vaginal swab also pending for further evaluation of vaginal discharge and screening for STIs.  Discussed Pyridium  as needed, OB/GYN follow-up for persisting symptoms.  Final Clinical Impressions(s) / UC Diagnoses   Final diagnoses:  Dysuria  Vaginal discharge  Suprapubic pain     Discharge Instructions      Make sure to drink plenty of water, I have sent over some Pyridium  for as needed use for burning and frequency.  Your urinalysis does not show a clear urinary tract infection today but I will send out a urine culture for further evaluation.  Vaginal swab is pending and we will let you know when the results return if anything comes back positive.  Avoid any scented soaps, feminine wipes or washes and you may try boric acid vaginal suppositories daily as needed.    ED Prescriptions     Medication Sig Dispense Auth. Provider   phenazopyridine  (PYRIDIUM ) 100 MG tablet Take 1 tablet (100 mg total) by mouth 3 (three) times daily as needed for pain. 10 tablet Stuart Vernell Norris, [place]      PDMP not reviewed this encounter.   Stuart Vernell Norris, [place] 06/08/23 1551

## 2023-06-06 NOTE — Discharge Instructions (Signed)
 Make sure to drink plenty of water, I have sent over some Pyridium  for as needed use for burning and frequency.  Your urinalysis does not show a clear urinary tract infection today but I will send out a urine culture for further evaluation.  Vaginal swab is pending and we will let you know when the results return if anything comes back positive.  Avoid any scented soaps, feminine wipes or washes and you may try boric acid vaginal suppositories daily as needed.

## 2023-06-07 LAB — CERVICOVAGINAL ANCILLARY ONLY
Bacterial Vaginitis (gardnerella): NEGATIVE
Candida Glabrata: NEGATIVE
Candida Vaginitis: NEGATIVE
Chlamydia: NEGATIVE
Comment: NEGATIVE
Comment: NEGATIVE
Comment: NEGATIVE
Comment: NEGATIVE
Comment: NEGATIVE
Comment: NORMAL
Neisseria Gonorrhea: NEGATIVE
Trichomonas: NEGATIVE

## 2023-06-07 LAB — URINE CULTURE: Culture: NO GROWTH

## 2023-06-12 ENCOUNTER — Encounter: Payer: Self-pay | Admitting: Emergency Medicine

## 2023-06-12 ENCOUNTER — Ambulatory Visit
Admission: EM | Admit: 2023-06-12 | Discharge: 2023-06-12 | Disposition: A | Discharge status: Home / Self Care | Payer: Medicaid Other | Attending: Physician Assistant | Admitting: Physician Assistant

## 2023-06-12 ENCOUNTER — Telehealth: Payer: Self-pay | Admitting: Emergency Medicine

## 2023-06-12 DIAGNOSIS — K120 Recurrent oral aphthae: Secondary | ICD-10-CM | POA: Diagnosis not present

## 2023-06-12 DIAGNOSIS — N898 Other specified noninflammatory disorders of vagina: Secondary | ICD-10-CM | POA: Insufficient documentation

## 2023-06-12 DIAGNOSIS — R590 Localized enlarged lymph nodes: Secondary | ICD-10-CM | POA: Diagnosis not present

## 2023-06-12 LAB — POCT MONO SCREEN (KUC): Mono, POC: NEGATIVE

## 2023-06-12 LAB — POCT RAPID STREP A (OFFICE): Rapid Strep A Screen: NEGATIVE

## 2023-06-12 MED ORDER — DOXYCYCLINE HYCLATE 100 MG PO CAPS: 100.0000 mg | ORAL_CAPSULE | Freq: Two times a day (BID) | ORAL | 0 refills | Status: DC

## 2023-06-12 MED ORDER — NYSTATIN 100000 UNIT/ML MT SUSP: 5.0000 mL | Freq: Three times a day (TID) | OROMUCOSAL | 0 refills | Status: DC | PRN

## 2023-06-12 NOTE — ED Provider Notes (Signed)
 [name]    CSN: [id number] Arrival date & time: 06/12/23  1236      History   Chief Complaint Chief Complaint  Patient presents with   Nodules on left side of neck x 2 months    HPI Barbara Zimmerman is a 33 y.o. female.   Patient present today with several concerns.  Her primary concern today is a several week history of enlarging painful left cervical lymph nodes.  She does report that over the past 3 days she has developed an ulcer in her gums with associated pain but denies any additional sore throat, fever, unintentional weight loss, nausea, vomiting.  She did take a few doses of amoxicillin  several weeks ago that she had from a previous prescription and she felt this provided some improvement of symptoms but not resolution.  She is eating and drinking normally.  She has not seen her primary care or had any advanced imaging.  She has tried ibuprofen  without improvement of symptoms.  She denies any known sick contacts.  She is very anxious because she read online that this could be related to cancer.  In addition, she reports a several day history of vaginal itching and discharge.  She describes this as malodorous.  She was seen by our clinic on 06/06/2023 at which point she was tested for UTI that was negative.  At that time she had a negative swab but was not symptomatic at the time of testing.  She is requesting repeat testing today.  Denies any changes to personal hygiene products including soaps or detergents.  Does report a few doses of amoxicillin  from a leftover prescription but denies full course of antibiotics recently.  She denies history of diabetes does not take SGLT2 inhibitor.  No concern for pregnancy.    Past Medical History:  Diagnosis Date   Asthma    childhood   Post partum depression    Tonsillitis     There are no active problems to display for this patient.   Past Surgical History:  Procedure Laterality Date   ABDOMINAL SURGERY     c  secion     CESAREAN SECTION      OB History     Gravida  3   Para  1   Term      Preterm  1   AB  2   Living  2      SAB  1   IAB  1   Ectopic      Multiple  1   Live Births  2            Home Medications    Prior to Admission medications   Medication Sig Start Date End Date Taking? Authorizing Provider  Azelastine HCl 137 MCG/SPRAY SOLN Place 2 sprays into the nose as directed. 02/22/21  Yes [provider]  doxycycline  (VIBRAMYCIN ) 100 MG capsule Take 1 capsule (100 mg total) by mouth 2 (two) times daily. 06/12/23  Yes Tamsin Nader, [place] K, PA-C  fluconazole  (DIFLUCAN ) 150 MG tablet Take 1 tablet of day 1, then take 1 tablet on day 8 11/07/22  Yes White, Adrienne R, NP  magic mouthwash (nystatin , lidocaine , diphenhydrAMINE ) suspension Take 5 mLs by mouth 3 (three) times daily as needed for mouth pain. Swish and spit 3 times daily as needed 06/12/23  Yes Jakyren Fluegge K, PA-C  omeprazole  (PRILOSEC) 20 MG capsule Take 20 mg by mouth daily. 11/27/22  Yes [provider]  polyethylene  glycol (MIRALAX  / GLYCOLAX ) 17 g packet Take 17 g by mouth daily. 06/13/22  Yes [provider]  TRI-ESTARYLLA  0.18/0.215/0.25 MG-35 MCG tablet Take 1 tablet by mouth daily. 06/28/21  Yes [provider]  omeprazole  (PRILOSEC) 20 MG capsule Take 1 capsule (20 mg total) by mouth daily. 04/05/22 11/07/22  Arnie Bibber, MD    Family History Family History  Problem Relation Age of Onset   Diabetes Mother     Social History Social History   Tobacco Use   Smoking status: Some Days    Current packs/day: 0.50    Average packs/day: 0.5 packs/day for 5.0 years (2.5 ttl pk-yrs)    Types: Cigarettes   Smokeless tobacco: Never  Vaping Use   Vaping status: Never Used  Substance Use Topics   Alcohol use: Not Currently   Drug use: No     Allergies   Penicillins, Shellfish allergy, and Metronidazole    Review of Systems Review of Systems   Constitutional:  Positive for activity change. Negative for appetite change, fatigue and fever.  HENT:  Positive for mouth sores. Negative for congestion, ear pain, sneezing and sore throat.   Respiratory:  Negative for cough and shortness of breath.   Cardiovascular:  Negative for chest pain.  Gastrointestinal:  Negative for abdominal pain, diarrhea, nausea and vomiting.  Genitourinary:  Positive for vaginal discharge. Negative for dysuria, frequency, pelvic pain, urgency, vaginal bleeding and vaginal pain.  Hematological:  Positive for adenopathy.     Physical Exam Triage Vital Signs ED Triage Vitals  Encounter Vitals Group     BP 06/12/23 1414 115/79     Systolic BP Percentile --      Diastolic BP Percentile --      Pulse Rate 06/12/23 1414 71     Resp 06/12/23 1414 16     Temp 06/12/23 1414 98 F (36.7 C)     Temp Source 06/12/23 1414 Oral     SpO2 06/12/23 1414 95 %     Weight 06/12/23 1416 230 lb (104.3 kg)     Height 06/12/23 1416 5\' 6"  (1.676 m)     Head Circumference --      Peak Flow --      Pain Score 06/12/23 1415 7     Pain Loc --      Pain Education --      Exclude from Growth Chart --    No data found.  Updated Vital Signs BP 115/79 (BP Location: Left Arm)   Pulse 71   Temp 98 F (36.7 C) (Oral)   Resp 16   Ht 5\' 6"  (1.676 m)   Wt 230 lb (104.3 kg)   LMP 05/20/2023 (Exact Date)   SpO2 95%   BMI 37.12 kg/m   Visual Acuity Right Eye Distance:   Left Eye Distance:   Bilateral Distance:    Right Eye Near:   Left Eye Near:    Bilateral Near:     Physical Exam Vitals reviewed.  Constitutional:      General: She is awake. She is not in acute distress.    Appearance: Normal appearance. She is well-developed. She is not ill-appearing.     Comments: Very pleasant female appears stated age in no acute distress sitting comfortably in exam room  HENT:     Head: Normocephalic and atraumatic.     Right Ear: Tympanic membrane, ear canal and external  ear normal. Tympanic membrane is not erythematous or bulging.  Left Ear: Tympanic membrane, ear canal and external ear normal. Tympanic membrane is not erythematous or bulging.     Nose:     Right Sinus: No maxillary sinus tenderness or frontal sinus tenderness.     Left Sinus: No maxillary sinus tenderness or frontal sinus tenderness.     Mouth/Throat:     Mouth: Oral lesions present.     Pharynx: Uvula midline. No oropharyngeal exudate or posterior oropharyngeal erythema.     Comments: 0.5 cm ulcer noted vestibule. Cardiovascular:     Rate and Rhythm: Normal rate and regular rhythm.     Heart sounds: Normal heart sounds, S1 normal and S2 normal. No murmur heard. Pulmonary:     Effort: Pulmonary effort is normal.     Breath sounds: Normal breath sounds. No wheezing, rhonchi or rales.     Comments: Clear to auscultation bilaterally Lymphadenopathy:     Head:     Right side of head: No submental, submandibular or tonsillar adenopathy.     Left side of head: Submandibular adenopathy present. No submental or tonsillar adenopathy.     Cervical: Cervical adenopathy present.     Right cervical: No superficial cervical adenopathy.    Left cervical: Superficial cervical adenopathy present.  Psychiatric:        Behavior: Behavior is cooperative.      UC Treatments / Results  Labs (all labs ordered are listed, but only abnormal results are displayed) Labs Reviewed  CBC WITH DIFFERENTIAL/PLATELET  COMPREHENSIVE METABOLIC PANEL  POCT RAPID STREP A (OFFICE)  POCT MONO SCREEN ([facility])  CERVICOVAGINAL ANCILLARY ONLY    EKG   Radiology No results found.  Procedures Procedures (including critical care time)  Medications Ordered in UC Medications - No data to display  Initial Impression / Assessment and Plan / UC Course  I have reviewed the triage vital signs and the nursing notes.  Pertinent labs & imaging results that were available during my care of the patient were reviewed  by me and considered in my medical decision making (see chart for details).     Patient is well-appearing, afebrile, nontoxic, nontachycardic.  She does have left-sided cervical lymphadenopathy that she reports has been present for several weeks.  She has had improvement in the past with antibiotics and so was given a short course of doxycycline  100 mg twice daily for 7 days given her history of penicillin allergy.  Discussed that she is to avoid prolonged sun exposure while on this medication.  Antibiotics can decrease the effectiveness of birth control so she is to use backup birth control while on this medication until her menstrual cycle.  Basic lab work including strep and mono testing were negative in clinic today.  CBC and CMP were obtained and are pending.  We discussed that if her symptoms are not improving quickly she should follow-up with her primary care to consider additional testing including potentially ultrasound that we do not have access to an urgent care.  She does have a mouth ulcer but no evidence of significant stomatitis.  We discussed that this could be contributing to her symptoms though it is unlikely given she has had swollen lymph nodes for prolonged period of time.  Strict return precautions given.  All questions answered to patient satisfaction.  STI swab was collected today.  We will contact her based on her results if we need to arrange treatment.  Recommended hypoallergenic soaps and detergents and she is to wear loosefitting cotton underwear.  Discussed that if  anything changes or worsens including pelvic pain, abdominal pain, vaginal discharge needs to be seen immediately.  Strict return precautions given.  Excuse note provided.  Final Clinical Impressions(s) / UC Diagnoses   Final diagnoses:  Cervical lymphadenopathy  Aphthous ulcer  Vaginal discharge     Discharge Instructions      You were negative for strep and mono.  We will contact you if any of your blood  work is abnormal.  Start doxycycline  100 mg twice daily for 7 days.  Stay out of the sun while on this medication.  If you are taking any kind of birth control this can decrease the effectiveness of your birth control so make sure to use backup birth control such as a condom while on this medication and until your next menstrual cycle.  Use Magic mouthwash up to 3 times a day.  Swish and spit this.  If your symptoms or not improving quickly please follow-up with your primary care as we discussed.  If anything worsens and you have enlarging lymph nodes, difficulty swallowing, fever, nausea/vomiting interfering with oral intake you need to be seen immediately.  I will contact you if your swab is abnormal we will need to start treatment.  Wear loosefitting cotton underwear and use hypoallergenic soaps and detergents.  If anything changes or worsens please return for reevaluation.     ED Prescriptions     Medication Sig Dispense Auth. Provider   magic mouthwash (nystatin , lidocaine , diphenhydrAMINE ) suspension Take 5 mLs by mouth 3 (three) times daily as needed for mouth pain. Swish and spit 3 times daily as needed 180 mL Luan Urbani K, PA-C   doxycycline  (VIBRAMYCIN ) 100 MG capsule Take 1 capsule (100 mg total) by mouth 2 (two) times daily. 14 capsule Briawna Carver K, PA-C      PDMP not reviewed this encounter.   Budd Cargo, PA-C 06/12/23 1519

## 2023-06-12 NOTE — ED Triage Notes (Signed)
 Patient c/o left sided nodules x 2.  This has been going on x 2 months.  Painful.  Patient has taken Ibuprofen .  There is a spot inside of mouth feels that it came from the swollen lymph nodes.

## 2023-06-12 NOTE — Discharge Instructions (Signed)
 You were negative for strep and mono.  We will contact you if any of your blood work is abnormal.  Start doxycycline  100 mg twice daily for 7 days.  Stay out of the sun while on this medication.  If you are taking any kind of birth control this can decrease the effectiveness of your birth control so make sure to use backup birth control such as a condom while on this medication and until your next menstrual cycle.  Use Magic mouthwash up to 3 times a day.  Swish and spit this.  If your symptoms or not improving quickly please follow-up with your primary care as we discussed.  If anything worsens and you have enlarging lymph nodes, difficulty swallowing, fever, nausea/vomiting interfering with oral intake you need to be seen immediately.  I will contact you if your swab is abnormal we will need to start treatment.  Wear loosefitting cotton underwear and use hypoallergenic soaps and detergents.  If anything changes or worsens please return for reevaluation.

## 2023-06-13 LAB — CBC WITH DIFFERENTIAL/PLATELET
Basophils Absolute: 0 10*3/uL (ref 0.0–0.2)
Basos: 0 %
EOS (ABSOLUTE): 0.2 10*3/uL (ref 0.0–0.4)
Eos: 6 %
Hematocrit: 40.5 % (ref 34.0–46.6)
Hemoglobin: 12.9 g/dL (ref 11.1–15.9)
Immature Grans (Abs): 0 10*3/uL (ref 0.0–0.1)
Immature Granulocytes: 0 %
Lymphocytes Absolute: 2.1 10*3/uL (ref 0.7–3.1)
Lymphs: 55 %
MCH: 27.7 pg (ref 26.6–33.0)
MCHC: 31.9 g/dL (ref 31.5–35.7)
MCV: 87 fL (ref 79–97)
Monocytes Absolute: 0.4 10*3/uL (ref 0.1–0.9)
Monocytes: 9 %
Neutrophils Absolute: 1.2 10*3/uL — ABNORMAL LOW (ref 1.4–7.0)
Neutrophils: 30 %
Platelets: 317 10*3/uL (ref 150–450)
RBC: 4.65 x10E6/uL (ref 3.77–5.28)
RDW: 12.8 % (ref 11.7–15.4)
WBC: 3.9 10*3/uL (ref 3.4–10.8)

## 2023-06-13 LAB — COMPREHENSIVE METABOLIC PANEL
ALT: 10 [IU]/L (ref 0–32)
AST: 14 [IU]/L (ref 0–40)
Albumin: 3.7 g/dL — ABNORMAL LOW (ref 3.9–4.9)
Alkaline Phosphatase: 61 [IU]/L (ref 44–121)
BUN/Creatinine Ratio: 19 (ref 9–23)
BUN: 11 mg/dL (ref 6–20)
Bilirubin Total: <0.2 mg/dL (ref 0.0–1.2)
CO2: 23 mmol/L (ref 20–29)
Calcium: 8.6 mg/dL — ABNORMAL LOW (ref 8.7–10.2)
Chloride: 103 mmol/L (ref 96–106)
Creatinine, Ser: 0.59 mg/dL (ref 0.57–1.00)
Globulin, Total: 2.7 g/dL (ref 1.5–4.5)
Glucose: 107 mg/dL — ABNORMAL HIGH (ref 70–99)
Potassium: 4.1 mmol/L (ref 3.5–5.2)
Sodium: 138 mmol/L (ref 134–144)
Total Protein: 6.4 g/dL (ref 6.0–8.5)
eGFR: 123 mL/min/{1.73_m2} (ref >59)

## 2023-06-13 LAB — CERVICOVAGINAL ANCILLARY ONLY
Bacterial Vaginitis (gardnerella): NEGATIVE
Candida Glabrata: NEGATIVE
Candida Vaginitis: NEGATIVE
Chlamydia: NEGATIVE
Comment: NEGATIVE
Comment: NEGATIVE
Comment: NEGATIVE
Comment: NEGATIVE
Comment: NEGATIVE
Comment: NORMAL
Neisseria Gonorrhea: NEGATIVE
Trichomonas: NEGATIVE

## 2023-06-21 ENCOUNTER — Other Ambulatory Visit: Payer: Self-pay

## 2023-06-21 ENCOUNTER — Encounter (HOSPITAL_BASED_OUTPATIENT_CLINIC_OR_DEPARTMENT_OTHER): Payer: Self-pay | Admitting: Emergency Medicine

## 2023-06-21 ENCOUNTER — Emergency Department (HOSPITAL_BASED_OUTPATIENT_CLINIC_OR_DEPARTMENT_OTHER): Payer: Medicaid Other

## 2023-06-21 ENCOUNTER — Other Ambulatory Visit (HOSPITAL_BASED_OUTPATIENT_CLINIC_OR_DEPARTMENT_OTHER): Payer: Self-pay

## 2023-06-21 ENCOUNTER — Emergency Department (HOSPITAL_BASED_OUTPATIENT_CLINIC_OR_DEPARTMENT_OTHER)
Admission: EM | Admit: 2023-06-21 | Discharge: 2023-06-21 | Disposition: A | Discharge status: Home / Self Care | Payer: Medicaid Other | Attending: Emergency Medicine | Admitting: Emergency Medicine

## 2023-06-21 DIAGNOSIS — R590 Localized enlarged lymph nodes: Secondary | ICD-10-CM | POA: Insufficient documentation

## 2023-06-21 DIAGNOSIS — R221 Localized swelling, mass and lump, neck: Secondary | ICD-10-CM | POA: Diagnosis present

## 2023-06-21 LAB — BASIC METABOLIC PANEL
Anion gap: 4 — ABNORMAL LOW (ref 5–15)
BUN: 13 mg/dL (ref 6–20)
CO2: 29 mmol/L (ref 22–32)
Calcium: 8.9 mg/dL (ref 8.9–10.3)
Chloride: 105 mmol/L (ref 98–111)
Creatinine, Ser: 0.64 mg/dL (ref 0.44–1.00)
GFR, Estimated: >60 mL/min (ref >60)
Glucose, Bld: 78 mg/dL (ref 70–99)
Potassium: 4.1 mmol/L (ref 3.5–5.1)
Sodium: 138 mmol/L (ref 135–145)

## 2023-06-21 LAB — CBC
HCT: 40.7 % (ref 36.0–46.0)
Hemoglobin: 13.2 g/dL (ref 12.0–15.0)
MCH: 28.4 pg (ref 26.0–34.0)
MCHC: 32.4 g/dL (ref 30.0–36.0)
MCV: 87.7 fL (ref 80.0–100.0)
Platelets: 362 10*3/uL (ref 150–400)
RBC: 4.64 MIL/uL (ref 3.87–5.11)
RDW: 13.2 % (ref 11.5–15.5)
WBC: 3.9 10*3/uL — ABNORMAL LOW (ref 4.0–10.5)
nRBC: 0 % (ref 0.0–0.2)

## 2023-06-21 MED ORDER — CLINDAMYCIN HCL 150 MG PO CAPS
150.0000 mg | ORAL_CAPSULE | Freq: Three times a day (TID) | ORAL | 0 refills | Status: DC
  Filled 2023-06-21: qty 21, 7d supply, fill #0

## 2023-06-21 MED ORDER — IOHEXOL 300 MG/ML  SOLN
75.0000 mL | Freq: Once | INTRAMUSCULAR | Status: AC | PRN
  Administered 2023-06-21: 75 mL via INTRAVENOUS

## 2023-06-21 MED ORDER — CLINDAMYCIN HCL 150 MG PO CAPS: 150.0000 mg | ORAL_CAPSULE | Freq: Three times a day (TID) | ORAL | 0 refills | Status: AC

## 2023-06-21 NOTE — Discharge Instructions (Signed)
Take the antibiotic as prescribed.  Follow-up with an ENT doctor for further evaluation of your persistent enlarged lymph nodes and enlarged tonsils

## 2023-06-21 NOTE — ED Notes (Signed)
ED Provider at bedside.

## 2023-06-21 NOTE — ED Notes (Signed)
Discharge paperwork given and verbally understood.

## 2023-06-21 NOTE — ED Provider Notes (Signed)
[place] [facility] AT [facility] Provider Note   CSN: [id number] Arrival date & time: 06/21/23  4098     History  Chief Complaint  Patient presents with   Neck swelling    Barbara Zimmerman is a 33 y.o. female.  HPI   Patient presents to the ED for evaluation of persistent swollen glands on the left side of her neck.  Patient states it has been there now for couple of months.  She denies having any trouble with any toothache.  She not having a sore throat.  Patient states she was seen at an urgent care about9 days ago and at that time they noted she had an ulcer on the inside of her mouth.  She states it may have helped a little bit but overall it is stayed about the same.  Patient states she was not able to get in contact with her primary care doctor.  She is concerned about the persistent swelling so she came to the ED  Home Medications Prior to Admission medications   Medication Sig Start Date End Date Taking? Authorizing Provider  clindamycin (CLEOCIN) 150 MG capsule Take 1 capsule (150 mg total) by mouth 3 (three) times daily for 7 days. 06/21/23 06/28/23 Yes Linwood Dibbles, MD  Azelastine HCl 137 MCG/SPRAY SOLN Place 2 sprays into the nose as directed. 02/22/21   [provider]  doxycycline (VIBRAMYCIN) 100 MG capsule Take 1 capsule (100 mg total) by mouth 2 (two) times daily. 06/12/23   Raspet, Noberto Retort, PA-C  fluconazole (DIFLUCAN) 150 MG tablet Take 1 tablet of day 1, then take 1 tablet on day 8 11/07/22   Valinda Hoar, NP  magic mouthwash (nystatin, lidocaine, diphenhydrAMINE) suspension Take 5 mLs by mouth 3 (three) times daily as needed for mouth pain. Swish and spit 3 times daily as needed 06/12/23   Raspet, Erin K, PA-C  omeprazole (PRILOSEC) 20 MG capsule Take 1 capsule (20 mg total) by mouth daily. 04/05/22 11/07/22  Mardene Sayer, MD  omeprazole (PRILOSEC) 20 MG capsule Take 20 mg by mouth daily. 11/27/22   [provider]   polyethylene glycol (MIRALAX / GLYCOLAX) 17 g packet Take 17 g by mouth daily. 06/13/22   [provider]  TRI-ESTARYLLA 0.18/0.215/0.25 MG-35 MCG tablet Take 1 tablet by mouth daily. 06/28/21   [provider]      Allergies    Penicillins, Shellfish allergy, and Metronidazole    Review of Systems   Review of Systems  Physical Exam Updated Vital Signs BP 123/63 (BP Location: Left Arm)   Pulse 67   Temp 98.4 F (36.9 C)   Resp 16   LMP 06/17/2023 (Exact Date)   SpO2 97%  Physical Exam Vitals and nursing note reviewed.  Constitutional:      General: She is not in acute distress.    Appearance: She is well-developed.  HENT:     Head: Normocephalic and atraumatic.     Comments: No oral lesions noted, no dental abnormality noted    Right Ear: External ear normal.     Left Ear: External ear normal.  Eyes:     General: No scleral icterus.       Right eye: No discharge.        Left eye: No discharge.     Conjunctiva/sclera: Conjunctivae normal.  Neck:     Trachea: No tracheal deviation.  Cardiovascular:     Rate and Rhythm: Normal rate.  Pulmonary:  Effort: Pulmonary effort is normal. No respiratory distress.     Breath sounds: No stridor.  Abdominal:     General: There is no distension.  Musculoskeletal:        General: No swelling or deformity.     Cervical back: Neck supple.  Lymphadenopathy:     Cervical: Cervical adenopathy present.     Right cervical: No superficial or deep cervical adenopathy.    Left cervical: Superficial cervical adenopathy and deep cervical adenopathy present.  Skin:    General: Skin is warm and dry.     Findings: No rash.  Neurological:     Mental Status: She is alert. Mental status is at baseline.     Cranial Nerves: No dysarthria or facial asymmetry.     Motor: No seizure activity.     ED Results / Procedures / Treatments   Labs (all labs ordered are listed, but only abnormal results are displayed) Labs Reviewed   CBC - Abnormal; Notable for the following components:      Result Value   WBC 3.9 (*)    All other components within normal limits  BASIC METABOLIC PANEL - Abnormal; Notable for the following components:   Anion gap 4 (*)    All other components within normal limits    EKG None  Radiology CT Soft Tissue Neck W Contrast Result Date: 06/21/2023 CLINICAL DATA:  Bilateral neck swelling. Persistent lymphadenopathy. Patient reports neck swelling for 2 months. Patient was treated with antibiotics following an urgent care visit 01/15. EXAM: CT NECK WITH CONTRAST TECHNIQUE: Multidetector CT imaging of the neck was performed using the standard protocol following the bolus administration of intravenous contrast. RADIATION DOSE REDUCTION: This exam was performed according to the departmental dose-optimization program which includes automated exposure control, adjustment of the mA and/or kV according to patient size and/or use of iterative reconstruction technique. CONTRAST:  75mL OMNIPAQUE IOHEXOL 300 MG/ML  SOLN COMPARISON:  CT neck with contrast 09/20/2016. FINDINGS: Pharynx and larynx: Adenoid tissue is within normal limits. Edematous changes are present within the soft palate the palatine tonsils are enlarged bilaterally and somewhat heterogeneous. No discrete abscess is present. The parapharyngeal fat is clear. Oropharyngeal airway is patent. Vallecula and epiglottis are within normal limits. Aryepiglottic folds and piriform sinuses are clear. Vocal cords are midline and symmetric. Trachea is clear. Salivary glands: The submandibular and parotid glands and ducts are within normal limits. Thyroid: Normal Lymph nodes: Prominent level 2 lymph nodes bilaterally are likely reactive, left greater than right. Prominent left level 3 nodes are also present. No necrotic or suppurative nodes are present. Vascular: No focal vascular lesions are present. Limited intracranial: Within normal limits. Visualized orbits: The  globes and orbits are within normal limits. Mastoids and visualized paranasal sinuses: A polyp or retention cyst is present in the right maxillary sinus. The paranasal sinuses and mastoid air cells are clear otherwise. Skeleton: Vertebral body heights are normal. Straightening of the normal cervical lordosis is present. No focal osseous lesions are present. Upper chest: The lung apices are clear. The thoracic inlet is within normal limits. IMPRESSION: 1. Enlarged and heterogeneous palatine tonsils bilaterally compatible with acute tonsillitis. 2. No discrete abscess. 3. Prominent level 2 lymph nodes bilaterally are likely reactive, left greater than right. Prominent left level 3 nodes are also present. No necrotic or suppurative nodes are present. These nodes all appear reactive. 4. Polyp or retention cyst in the right maxillary sinus. Electronically Signed   By: Virl Son.D.  On: 06/21/2023 11:24    Procedures Procedures    Medications Ordered in ED Medications  iohexol (OMNIPAQUE) 300 MG/ML solution 75 mL (75 mLs Intravenous Contrast Given 06/21/23 1029)    ED Course/ Medical Decision Making/ A&P Clinical Course as of 06/21/23 1157  Fri Jun 21, 2023  0959 CBC(!) Hemoglobin unchanged compared to previous.  Slightly below normal value.  Doubt clinically significant.  Can be routinely followed up as outpatient with next PCP visit [JK]  1134 Metabolic panel is normal [JK]  1134 CT scan shows enlarged and heterogeneous tonsils suggestive of tonsillitis.  Patient also has prominent lymph nodes bilaterally left greater than right [JK]    Clinical Course User Index [JK] Linwood Dibbles, MD                                 Medical Decision Making Problems Addressed: Cervical lymphadenopathy: acute illness or injury that poses a threat to life or bodily functions  Amount and/or Complexity of Data Reviewed Labs: ordered. Decision-making details documented in ED Course. Radiology: ordered  and independent interpretation performed.  Risk Prescription drug management.   Patient presented to ED for evaluation of persistent lymphadenopathy.  Patient states symptoms have been ongoing for couple of months now.  Patient has been on intermittent regimens of antibiotics.  Patient came to the ED because of her persistent swelling.  On exam patient does have lymphadenopathy.   CT scan was performed and it does show evidence of lymphadenopathy but no signs of abscess or mass.  CT also suggests tonsillitis.  Unclear if she does have acute infection at this time as I do not see any exudate or erythema.  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  Will start her on a course of antibiotics for possible acute infection recommend outpatient follow-up with ENT for further evaluation.         Final Clinical Impression(s) / ED Diagnoses Final diagnoses:  Cervical lymphadenopathy    Rx / DC Orders ED Discharge Orders          Ordered    clindamycin (CLEOCIN) 150 MG capsule  3 times daily        06/21/23 1155              Linwood Dibbles, MD 06/21/23 1157

## 2023-06-21 NOTE — ED Triage Notes (Signed)
Pt c/o "neck swelling" x 2 months, and also reports chills. Pt tx at [facility] on 1/15, tx with abx.Pt reports bilateral neck swelling still present, and also reports fatigue

## 2023-07-10 ENCOUNTER — Ambulatory Visit (INDEPENDENT_AMBULATORY_CARE_PROVIDER_SITE_OTHER): Payer: Medicaid Other | Admitting: Otolaryngology

## 2023-07-10 ENCOUNTER — Encounter (INDEPENDENT_AMBULATORY_CARE_PROVIDER_SITE_OTHER): Payer: Self-pay | Admitting: Otolaryngology

## 2023-07-10 VITALS — BP 131/81 | HR 87 | Ht 66.0 in | Wt 220.0 lb

## 2023-07-10 DIAGNOSIS — J3089 Other allergic rhinitis: Secondary | ICD-10-CM

## 2023-07-10 DIAGNOSIS — J351 Hypertrophy of tonsils: Secondary | ICD-10-CM

## 2023-07-10 DIAGNOSIS — R067 Sneezing: Secondary | ICD-10-CM | POA: Diagnosis not present

## 2023-07-10 DIAGNOSIS — R590 Localized enlarged lymph nodes: Secondary | ICD-10-CM | POA: Diagnosis not present

## 2023-07-10 DIAGNOSIS — J312 Chronic pharyngitis: Secondary | ICD-10-CM

## 2023-07-10 DIAGNOSIS — J3501 Chronic tonsillitis: Secondary | ICD-10-CM | POA: Diagnosis not present

## 2023-07-10 DIAGNOSIS — R0981 Nasal congestion: Secondary | ICD-10-CM

## 2023-07-10 MED ORDER — FLUTICASONE PROPIONATE 50 MCG/ACT NA SUSP: 2.0000 | Freq: Two times a day (BID) | NASAL | 6 refills | Status: DC

## 2023-07-10 MED ORDER — OMEPRAZOLE 20 MG PO CPDR: 20.0000 mg | DELAYED_RELEASE_CAPSULE | Freq: Every day | ORAL | 3 refills | Status: DC

## 2023-07-10 MED ORDER — CETIRIZINE HCL 10 MG PO TABS: 10.0000 mg | ORAL_TABLET | Freq: Every day | ORAL | 11 refills | Status: DC

## 2023-07-10 MED ORDER — SULFAMETHOXAZOLE-TRIMETHOPRIM 800-160 MG PO TABS: 1.0000 | ORAL_TABLET | Freq: Two times a day (BID) | ORAL | 0 refills | Status: DC

## 2023-07-10 NOTE — Patient Instructions (Signed)
Lloyd Huger Med Nasal Saline Rinse   - start nasal saline rinses with [employer]Med Bottle available over the counter or online to help with nasal congestion    - Take Reflux Gourmet (natural supplement available on Amazon) to help with symptoms of chronic throat irritation    GamingLesson.nl - check out this website to learn more about reflux   -Avoid lying down for at least two hours after a meal or after drinking acidic beverages, like soda, or other caffeinated beverages. This can help to prevent stomach contents from flowing back into the esophagus. -Keep your head elevated while you sleep. Using an extra pillow or two can also help to prevent reflux. -Eat smaller and more frequent meals each day instead of a few large meals. This promotes digestion and can aid in preventing heartburn. -Wear loose-fitting clothes to ease pressure on the stomach, which can worsen heartburn and reflux. -Reduce excess weight around the midsection. This can ease pressure on the stomach. Such pressure can force some stomach contents back up the esophagus

## 2023-07-10 NOTE — Progress Notes (Signed)
ENT CONSULT:  Reason for Consult: cervical lymphadenopathy left side since November 2024  HPI: Discussed the use of AI scribe software for clinical note transcription with the patient, who gave verbal consent to proceed.  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   She has been experiencing persistent lymphadenopathy since November, 2024 initially presenting as a lump on the left side of her neck, which later recurred. A CT scan in January revealed bilateral reactive cervical lymph nodes. The swelling fluctuates in size and is sometimes exacerbated by sleeping positions. She experiences right tonsil pain and swelling, as well as persistent right ear pain. Nasal congestion and postnasal drainage are also present. No pain with swallowing. No dental pain.   Her symptoms began with body aches, consistent sneezing, cold chills without fever, and a tingling sensation in her hands. Recently, her hands have started burning and feeling as if they are on fire, with difficulty in movement, particularly in the right hand, which also has shooting pain up to the shoulder. These symptoms have significantly impacted her daily activities, leading to her being out of work.  She is currently taking omeprazole for reflux, though she has not taken it in a week. She has a history of taking clindamycin and doxycycline for her symptoms, which were ineffective. She reports an allergy to Flagyl and a sensitivity to penicillin, which causes itching if taken for more than a few days.  Her past medical history includes juvenile rheumatoid arthritis and childhood asthma, with a significant hospitalization during elementary school. She recalls having a catheter placed near her heart and experiencing a severe rash. She has no known chronic illnesses like  diabetes.  She has been experiencing stress and reports a weak immune system since childhood, despite taking vitamins and natural supplements. She has young children at home.     Records Reviewed:  Telehealth encounter with Roselyn Reef 06/26/23 Assessment and Plan   1. Cervical lymphadenopathy (Primary) -Of unknown etiology; based on specialist visit and results, may need to heme/onc referral as well in the future. -Reviewed most recent ED visit from 06/21/23 and imaging of neck CT. Also reviewed labs and it appears hepatitis panel, HIV/RPR, CBC and BMP were assessed with only mild abnormality noted to WBCs and ?tonsillitis findings seen on neck CT along with reactive lymph nodes and polyp/cyst noted in right maxillary sinuses. -Does not appear that thyroid was assessed and she is noted to have an appointment with the specialist on 07/10/23, but she would like to be seen sooner than this. -Will attempt to place a new referral to see if she can be seen sooner per her request. However, she was advised not to cancel that appointment on 07/10/23.  - THYROID PANEL WITH TSH; Future - FE+CBC/D/PLT/TIBC/FER/RETIC; Future - REFERRAL TO ENT - HIV 1/0/2 AG/AB W/CASCADE RFLX SUPPLEMENTAL TESTING; Future    ED visit 06/21/23  Patient presents to the ED for evaluation of persistent swollen glands on the left side of her neck.  Patient states it has been there now for couple of months.  She denies having any trouble with any toothache.  She not having a sore throat.  Patient states she was seen at an urgent care about9 days ago and at that time they noted she had an ulcer on the inside of her mouth.  She  states it may have helped a little bit but overall it is stayed about the same.  Patient states she was not able to get in contact with her primary care doctor.  She is concerned about the persistent swelling so she came to the ED   Patient presented to ED for evaluation of persistent lymphadenopathy. Patient  states symptoms have been ongoing for couple of months now. Patient has been on intermittent regimens of antibiotics. Patient came to the ED because of her persistent swelling. On exam patient does have lymphadenopathy. CT scan was performed and it does show evidence of lymphadenopathy but no signs of abscess or mass. CT also suggests tonsillitis. Unclear if she does have acute infection at this time as I do not see any exudate or erythema. 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 Will start her on a course of antibiotics for possible acute infection recommend outpatient follow-up with ENT for further evaluation.   Sent home with Clindamycin 150 mg TID  Past Medical History:  Diagnosis Date   Asthma    childhood   Post partum depression    Tonsillitis     Past Surgical History:  Procedure Laterality Date   ABDOMINAL SURGERY     c secion     CESAREAN SECTION      Family History  Problem Relation Age of Onset   Diabetes Mother     Social History:  reports that she has quit smoking. Her smoking use included cigarettes. She has a 2.5 pack-year smoking history. She has never used smokeless tobacco. She reports that she does not currently use alcohol. She reports that she does not use drugs.  Allergies:  Allergies  Allergen Reactions   Penicillins     Other reaction(s): Hives, Itching of the skin or eyes ()   Shellfish Allergy Swelling    clams   Metronidazole Itching and Rash    Pt reports allergic to pill form only.  She reports she can use metrogel w/o any allergic reaction  She reports she can use metrogel w/o any allergic reaction    Pt reports allergic to pill form only.    Medications: I have reviewed the patient's current medications.  The PMH, PSH, Medications, Allergies, and SH were reviewed and updated.  ROS: Constitutional: Negative for fever, weight loss and weight gain. Cardiovascular: Negative for chest pain and dyspnea on  exertion. Respiratory: Is not experiencing shortness of breath at rest. Gastrointestinal: Negative for nausea and vomiting. Neurological: Negative for headaches. Psychiatric: The patient is not nervous/anxious  Blood pressure 131/81, pulse 87, height 5\' 6"  (1.676 m), weight 220 lb (99.8 kg), last menstrual period 06/17/2023, SpO2 96%. Body mass index is 35.51 kg/m.  PHYSICAL EXAM:  Exam: General: Well-developed, well-nourished Communication and Voice: raspy Respiratory Respiratory effort: Equal inspiration and expiration without stridor Cardiovascular Peripheral Vascular: Warm extremities with equal color/perfusion Eyes: No nystagmus with equal extraocular motion bilaterally Neuro/Psych/Balance: Patient oriented to person, place, and time; Appropriate mood and affect; Gait is intact with no imbalance; Cranial nerves I-XII are intact Head and Face Inspection: Normocephalic and atraumatic without mass or lesion Palpation: Facial skeleton intact without bony stepoffs Salivary Glands: No mass or tenderness Facial Strength: Facial motility symmetric and full bilaterally ENT Pinna: External ear intact and fully developed External canal: Canal is patent with intact skin Tympanic Membrane: Clear and mobile External Nose: No scar or anatomic deformity Internal Nose: Septum is deviated with S-shaped septum. No polyp, or purulence. Mucosal edema and erythema  present.  Bilateral inferior turbinate hypertrophy.  Lips, Teeth, and gums: Mucosa and teeth intact and viable TMJ: No pain to palpation with full mobility Oral cavity/oropharynx: No erythema or exudate, no lesions present 1-2+ tonsils Nasopharynx: No mass or lesion with intact mucosa Hypopharynx: Intact mucosa without pooling of secretions Larynx Glottic: Full true vocal cord mobility without lesion or mass Supraglottic: Normal appearing epiglottis and AE folds Interarytenoid Space: Moderate pachydermia&edema Subglottic Space:  Patent without lesion or edema Neck Neck and Trachea: Midline trachea without mass or lesion Thyroid: No mass or nodularity Lymphatics: Palpable bilateral level 2-3 lymphadenopathy and prominent node left neck posterior to SCM with some tenderness to palpation, no overlying skin changes  Procedure: Preoperative diagnosis: cervical lymphadenopathy sore throat  Postoperative diagnosis:   Same + GERD LPR tonsillar hypertrophy  Procedure: Flexible fiberoptic laryngoscopy  Surgeon: Ashok Croon, MD  Anesthesia: Topical lidocaine and Afrin Complications: None Condition is stable throughout exam  Indications and consent:  The patient presents to the clinic with dysphonia.  Indirect laryngoscopy view was incomplete. Thus it was recommended that they undergo a flexible fiberoptic laryngoscopy. All of the risks, benefits, and potential complications were reviewed with the patient preoperatively and verbal informed consent was obtained.  Procedure: The patient was seated upright in the clinic. Topical lidocaine and Afrin were applied to the nasal cavity. After adequate anesthesia had occurred, I then proceeded to pass the flexible telescope into the nasal cavity. The nasal cavity was patent without rhinorrhea or polyp. The nasopharynx was also patent without mass or lesion. The base of tongue was visualized and was normal. There were no signs of pooling of secretions in the piriform sinuses. The true vocal folds were mobile bilaterally. There were no signs of glottic or supraglottic mucosal lesion or mass. There was moderate interarytenoid pachydermia and post cricoid edema. The telescope was then slowly withdrawn and the patient tolerated the procedure throughout.      Studies Reviewed: CT neck with contrast 06/21/23 CLINICAL DATA:  Bilateral neck swelling. Persistent lymphadenopathy. Patient reports neck swelling for 2 months. Patient was treated with antibiotics following an urgent care  visit 01/15.   EXAM: CT NECK WITH CONTRAST   TECHNIQUE: Multidetector CT imaging of the neck was performed using the standard protocol following the bolus administration of intravenous contrast.   RADIATION DOSE REDUCTION: This exam was performed according to the departmental dose-optimization program which includes automated exposure control, adjustment of the mA and/or kV according to patient size and/or use of iterative reconstruction technique.   CONTRAST:  75mL OMNIPAQUE IOHEXOL 300 MG/ML  SOLN   COMPARISON:  CT neck with contrast 09/20/2016.   FINDINGS: Pharynx and larynx: Adenoid tissue is within normal limits. Edematous changes are present within the soft palate the palatine tonsils are enlarged bilaterally and somewhat heterogeneous. No discrete abscess is present. The parapharyngeal fat is clear. Oropharyngeal airway is patent. Vallecula and epiglottis are within normal limits. Aryepiglottic folds and piriform sinuses are clear. Vocal cords are midline and symmetric. Trachea is clear.   Salivary glands: The submandibular and parotid glands and ducts are within normal limits.   Thyroid: Normal   Lymph nodes: Prominent level 2 lymph nodes bilaterally are likely reactive, left greater than right. Prominent left level 3 nodes are also present. No necrotic or suppurative nodes are present.   Vascular: No focal vascular lesions are present.   Limited intracranial: Within normal limits.   Visualized orbits: The globes and orbits are within normal limits.   Mastoids  and visualized paranasal sinuses: A polyp or retention cyst is present in the right maxillary sinus. The paranasal sinuses and mastoid air cells are clear otherwise.   Skeleton: Vertebral body heights are normal. Straightening of the normal cervical lordosis is present. No focal osseous lesions are present.   Upper chest: The lung apices are clear. The thoracic inlet is within normal limits.    IMPRESSION: 1. Enlarged and heterogeneous palatine tonsils bilaterally compatible with acute tonsillitis. 2. No discrete abscess. 3. Prominent level 2 lymph nodes bilaterally are likely reactive, left greater than right. Prominent left level 3 nodes are also present. No necrotic or suppurative nodes are present. These nodes all appear reactive. 4. Polyp or retention cyst in the right maxillary sinus.    CBC    Component Value Date/Time   WBC 3.9 (L) 06/21/2023 0936   RBC 4.64 06/21/2023 0936   HGB 13.2 06/21/2023 0936   HGB 12.9 06/12/2023 1503   HCT 40.7 06/21/2023 0936   HCT 40.5 06/12/2023 1503   PLT 362 06/21/2023 0936   PLT 317 06/12/2023 1503   MCV 87.7 06/21/2023 0936   MCV 87 06/12/2023 1503   MCH 28.4 06/21/2023 0936   MCHC 32.4 06/21/2023 0936   RDW 13.2 06/21/2023 0936   RDW 12.8 06/12/2023 1503   LYMPHSABS 2.1 06/12/2023 1503   MONOABS 0.4 04/05/2022 1044   EOSABS 0.2 06/12/2023 1503   BASOSABS 0.0 06/12/2023 1503   TSH/T4 normal 9 days ago  Assessment/Plan: Encounter Diagnoses  Name Primary?   Cervical lymphadenopathy Yes   Tonsillar hypertrophy    Sneezing    Environmental and seasonal allergies    Chronic sore throat    Chronic nasal congestion     Assessment and Plan    Chronic Lymphadenopathy x 3 mo Persistent lymphadenopathy since November, primarily on the left side of the neck, with fluctuating size. CT scan showed reactive nodes bilaterally. Differential includes chronic tonsillitis, low-grade infection, or upper respiratory viral illness causing reactive lymphadenopathy. No concerning masses or lesions noted on scope exam today. Patient reports chills without fever, tingling and burning sensation in hands. Further evaluation by rheumatology and hematology/oncology is warranted.  We discussed that it is a little unusual to have prominent lymph nodes for 3 months, and she may require further evaluation and biopsy in the future, but overall her  exam today was reassuring and she had normal labs including CBC with differential.  I explained that she will benefit from evaluation by heme-onc to determine any risk factors for hematologic malignancy since her exam did not reveal any concerning findings to suggest head neck cancer and her nodes appear to be reactive on CT. she may require interval imaging to reevaluate the size and extent of lymphadenopathy in a few months. - Repeat CT neck vs U/S in a few months to evaluate cervical lymphadenopathy - Refer to rheumatology for further evaluation of her sx of burning in her hands 2/2 hx of juvenile RA - Refer to hematology/oncology for further evaluation to determine if she has any signs/sx suggestive of hematology malignancy  - Consider excisional biopsy of a lymph node for tissue diagnosis if sx will not improve  Chronic Tonsillitis Intermittent tonsillar inflammation with sore throat. Previous treatment with clindamycin and doxycycline was ineffective. Discussed potential benefits and risks of antibiotic therapy, including side effects and need for further treatment if symptoms persist. - Prescribe Bactrim DS BID as empiric therapy for chronic tonsillitis  - Refill omeprazole 20 mg for reflux daily  -  Prescribe Zyrtec 10 mg at night - Prescribe Flonase 2 puffs b/l nares BID nasal spray  Postnasal Drip Persistent sneezing, nasal congestion, and postnasal drainage likely contributing to chronic throat irritation. No significant obstruction noted on scope exam. Discussed management with nasal spray and antihistamine. - Prescribe Zyrtec 10 mg at night - Prescribe Flonase 2 puffs b/l nares BID nasal spray  Juvenile Rheumatoid Arthritis No recent flare-ups. Referred to rheumatology for evaluation of potential relapse given new symptoms of tingling and burning sensation in hands. Rheumatology to order appropriate tests to assess for relapse and manage symptoms. - Refer to rheumatology for  evaluation  Follow-up - Follow-up in three months after Rheum and Heme/Onc consultation  - CC primary care doctor on the note    Thank you for allowing me to participate in the care of this patient. Please do not hesitate to contact me with any questions or concerns.   Ashok Croon, MD Otolaryngology [facility] Phone: [phone] Fax: [phone]  07/10/2023, 2:49 PM

## 2023-07-11 ENCOUNTER — Encounter: Payer: Self-pay | Admitting: Medical Oncology

## 2023-07-11 ENCOUNTER — Emergency Department (HOSPITAL_BASED_OUTPATIENT_CLINIC_OR_DEPARTMENT_OTHER)
Admission: EM | Admit: 2023-07-11 | Discharge: 2023-07-11 | Disposition: A | Discharge status: Home / Self Care | Payer: Medicaid Other | Attending: Emergency Medicine | Admitting: Emergency Medicine

## 2023-07-11 ENCOUNTER — Other Ambulatory Visit: Payer: Self-pay

## 2023-07-11 ENCOUNTER — Encounter (HOSPITAL_BASED_OUTPATIENT_CLINIC_OR_DEPARTMENT_OTHER): Payer: Self-pay | Admitting: Emergency Medicine

## 2023-07-11 DIAGNOSIS — J45909 Unspecified asthma, uncomplicated: Secondary | ICD-10-CM | POA: Diagnosis not present

## 2023-07-11 DIAGNOSIS — M79641 Pain in right hand: Secondary | ICD-10-CM | POA: Diagnosis not present

## 2023-07-11 DIAGNOSIS — R52 Pain, unspecified: Secondary | ICD-10-CM

## 2023-07-11 DIAGNOSIS — M7989 Other specified soft tissue disorders: Secondary | ICD-10-CM | POA: Diagnosis present

## 2023-07-11 MED ORDER — IBUPROFEN 800 MG PO TABS: 800.0000 mg | ORAL_TABLET | Freq: Three times a day (TID) | ORAL | 0 refills | Status: DC

## 2023-07-11 NOTE — ED Notes (Signed)
Initial contact made. Pt is sitting in chair. Pt states she has had intermittent numbness and tingling bilateral hands that started appx a month ago with intermittent pain and cramping bilateral hands that follows the numbness and tingling that started appx 1-2 weeks ago.

## 2023-07-11 NOTE — ED Triage Notes (Signed)
Pt via pov from home with right hand swelling x weeks and pain x 3 weeks. She has been seen by several docs regarding lymph nodes and has been referred to a doc for the hand but it will be a while before she can be seen. Pt states there are lots of tests going on regarding her symptoms, but this is unrelated to the lymph nodes. She had the tingling and swelling when she was seen before but did not request evaluation because it wasn't bad. She reports pain has increased and she is unable to close her right hand. She reports "it feels like it is locked." Pt alert & oriented, nad noted.

## 2023-07-11 NOTE — ED Provider Notes (Signed)
[place] EMERGENCY DEPARTMENT AT [facility] Provider Note   CSN: [id number] Arrival date & time: 07/11/23  1355     History  Chief Complaint  Patient presents with   Hand Problem    Barbara Zimmerman is a 33 y.o. female with past medical history of asthma, depression presents to emergency department for evaluation of right hand swelling, burning pain for past 3 weeks.  She endorses difficulty with opening a jar.  She denies falls, trauma to hand  HPI    Home Medications Prior to Admission medications   Medication Sig Start Date End Date Taking? Authorizing Provider  ibuprofen (ADVIL) 800 MG tablet Take 1 tablet (800 mg total) by mouth 3 (three) times daily. 07/11/23  Yes Judithann Sheen, PA  Azelastine HCl 137 MCG/SPRAY SOLN Place 2 sprays into the nose as directed. 02/22/21   [provider]  cetirizine (ZYRTEC) 10 MG tablet Take 1 tablet (10 mg total) by mouth daily. 07/10/23   Ashok Croon, MD  fluconazole (DIFLUCAN) 150 MG tablet Take 1 tablet of day 1, then take 1 tablet on day 8 11/07/22   White, Elita Boone, NP  fluticasone (FLONASE) 50 MCG/ACT nasal spray Place 2 sprays into both nostrils 2 (two) times daily. 07/10/23   Ashok Croon, MD  magic mouthwash (nystatin, lidocaine, diphenhydrAMINE) suspension Take 5 mLs by mouth 3 (three) times daily as needed for mouth pain. Swish and spit 3 times daily as needed 06/12/23   Raspet, Erin K, PA-C  omeprazole (PRILOSEC) 20 MG capsule Take 1 capsule (20 mg total) by mouth daily. 07/10/23   Ashok Croon, MD  polyethylene glycol (MIRALAX / GLYCOLAX) 17 g packet Take 17 g by mouth daily. 06/13/22   [provider]  sulfamethoxazole-trimethoprim (BACTRIM DS) 800-160 MG tablet Take 1 tablet by mouth 2 (two) times daily. 07/10/23   Ashok Croon, MD  TRI-ESTARYLLA 0.18/0.215/0.25 MG-35 MCG tablet Take 1 tablet by mouth daily. 06/28/21   [provider]      Allergies    Penicillins, Shellfish  allergy, and Metronidazole    Review of Systems   Review of Systems  Constitutional:  Negative for chills, fatigue and fever.  Respiratory:  Negative for cough, chest tightness, shortness of breath and wheezing.   Cardiovascular:  Negative for chest pain and palpitations.  Gastrointestinal:  Negative for abdominal pain, constipation, diarrhea, nausea and vomiting.  Neurological:  Negative for dizziness, seizures, weakness, light-headedness, numbness and headaches.    Physical Exam Updated Vital Signs BP (!) 137/120 (BP Location: Right Arm)   Pulse 75   Temp 98.1 F (36.7 C) (Oral)   Resp 18   Ht 5\' 6"  (1.676 m)   Wt 99.8 kg   LMP 06/17/2023   SpO2 100%   BMI 35.51 kg/m  Physical Exam Vitals and nursing note reviewed.  Constitutional:      General: She is not in acute distress.    Appearance: Normal appearance.  HENT:     Head: Normocephalic and atraumatic.  Eyes:     Conjunctiva/sclera: Conjunctivae normal.  Cardiovascular:     Rate and Rhythm: Normal rate.     Pulses: Normal pulses.          Radial pulses are 2+ on the right side and 2+ on the left side.     Heart sounds: Normal heart sounds.  Pulmonary:     Effort: Pulmonary effort is normal. No respiratory distress.     Breath sounds: Normal breath sounds.  Musculoskeletal:  Cervical back: Normal range of motion and neck supple. No rigidity or tenderness.     Comments: 4/5 motor to right hand -difficulty with making a full complete fist without pain.  5/5 of right shoulder, right elbow, right wrist. +tinel and phalen test to right hand.   Skin:    General: Skin is warm.     Capillary Refill: Capillary refill takes less than 2 seconds.     Coloration: Skin is not jaundiced or pale.     Comments: No swelling, warmth, erythremia, nor infectious signs to BUE  Neurological:     Mental Status: She is alert and oriented to person, place, and time. Mental status is at baseline.     Coordination: Coordination normal.      Gait: Gait normal.     Comments: 2/2 of C3-T2 BUE     ED Results / Procedures / Treatments   Labs (all labs ordered are listed, but only abnormal results are displayed) Labs Reviewed - No data to display  EKG None  Radiology No results found.  Procedures Procedures    Medications Ordered in ED Medications - No data to display  ED Course/ Medical Decision Making/ A&P                                 Medical Decision Making Risk Prescription drug management.     Patient presents to the ED for concern of right hand pain, this involves an extensive number of treatment options, and is a complaint that carries with it a high risk of complications and morbidity.  The differential diagnosis includes fracture, contusion, nerve injury, carpal tunnel, injury, cervical radiculopathy.  Less likely CVA   Co morbidities that complicate the patient evaluation  None   Additional history obtained:  Additional history obtained from Nursing   External records from outside source obtained and reviewed including triage RN note    Medicines ordered and prescription drug management:  I ordered medication including ibuprofen for pain   Test Considered:  XR     Problem List / ED Course:  Right hand pain Burning pain Patient denies recent trauma to hand.  Therefore do not feel that x-ray is required at this time.  I had shared decision-making with patient regarding obtaining x-ray and we both agree that is not required at this time as it would also likely be obtained by hand surgery. Based on physical exam, I am suspicious for carpal tunnel.  She describes a burning pain that is worsened with both Tinel and Phalen signs.  She is having difficulty with opening jars which would also track I do not see any emergent cause at this time. I recommended patient to use wrist splints to avoid further compression of carpal tunnel to see if this improves pain and provided patient  with hand surgery follow-up I discussed disposition, return to emerged part precautions with patient expressed understanding present plan.  All questions answered to her satisfaction.  She is agreeable discharge.   Reevaluation:  After the interventions noted above, I reevaluated the patient and found that they have :stayed the same    Dispostion:  After consideration of the diagnostic results and the patients response to treatment, I feel that the patent would benefit from outpatient management with hand surgery.    Final Clinical Impression(s) / ED Diagnoses Final diagnoses:  Burning pain  Hand pain, right    Rx / DC Orders  ED Discharge Orders          Ordered    ibuprofen (ADVIL) 800 MG tablet  3 times daily        07/11/23 2009              Judithann Sheen, [place] 07/11/23 2333    Laurence Spates, MD 07/12/23 1800

## 2023-07-11 NOTE — Discharge Instructions (Addendum)
Thank you for letting us evaluate you today.  I provided you with a orthopedic referral for you to follow-up with.  I am curious if you have carpal tunnel causing your pain.   Return to emergency department if you experience weakness of entire extremity of 1 side of your body, slurred speech, altered mentation

## 2023-07-14 NOTE — Progress Notes (Deleted)
 [facility] [phone]   [phone]  INITIAL CONSULTATION:  Patient Care Team: Inc, [facility] as PCP - General (Pediatrics)  CHIEF COMPLAINTS/PURPOSE OF CONSULTATION:  "lymphadenopathy"  HISTORY OF PRESENTING ILLNESS:  Barbara Zimmerman 33 y.o. female with no chronic medical history. ?Juvenile rheumatoid arthritis GERD,  On review of the previous records ***  CT neck 06/21/23 showing enlarged and heterogeneous palatine tonsils bilaterally compatible with acute tonsillitis without abscess. Also prominent level 2 lymph nodes bilaterally are likely reactive, left greater than right. Prominent left level 3 nodes are also present. There are no necrotic or suppurative nodes are present. Radiologist adds that these nodes all appear reactive. CBC with mild leukopenia 3.9. ED provider referred to ENT and treated with a course of clindamycin.  Patient had ENT consult on 07/10/23. Patient had flexible fiberoptic laryngoscopy in clinic that was unrevealing. She was prescribed a course of bactrim for possible chronic sinusitis. Referral sent to rheumatologist for potential relapse with the burning and tingling sensation in her hands.   Patient presented to  [facility] 06/12/23 for nodules on the left side of her neck x 2 months. Provider noted patient had an aphthous ulcer and left submandibular, cervical, and superficial adenopathy. CBC and CMP were overall unremarkable. POCT rapid strep and mono screenings were negative. She was treated with a course of doxycycline.   Normal TSH/T4 On exam today ***  MEDICAL HISTORY:  Past Medical History:  Diagnosis Date  . Asthma    childhood  . Post partum depression   . Tonsillitis     SURGICAL HISTORY: Past Surgical History:  Procedure Laterality Date  . ABDOMINAL SURGERY    . c secion    . CESAREAN SECTION      SOCIAL HISTORY: Social History   Socioeconomic History   . Marital status: Single    Spouse name: Not on file  . Number of children: Not on file  . Years of education: Not on file  . Highest education level: Not on file  Occupational History  . Not on file  Tobacco Use  . Smoking status: Former    Current packs/day: 0.50    Average packs/day: 0.5 packs/day for 5.0 years (2.5 ttl pk-yrs)    Types: Cigarettes  . Smokeless tobacco: Never  Vaping Use  . Vaping status: Never Used  Substance and Sexual Activity  . Alcohol use: Not Currently  . Drug use: No  . Sexual activity: Yes    Partners: Male    Birth control/protection: Pill, None  Other Topics Concern  . Not on file  Social History Narrative  . Not on file   Social Drivers of Health   Financial Resource Strain: Not at Risk (08/20/2022)   Received from [facility], [employer]   . Financial Resource Strain: 1  Food Insecurity: Not at Risk (08/20/2022)   Received from [place], [employer]   . Food: 1  Transportation Needs: Not at Risk (08/20/2022)   Received from [facility], [employer]   . Transportation: 1  Physical Activity: Not on File (09/14/2021)   Received from [place], [place]   Physical Activity   . Physical Activity: 0  Stress: Not on File (09/14/2021)   Received from [place], [place]   Stress   . Stress: 0  Social Connections: Not on File (02/03/2023)   Received from [employer]   . Connectedness: 0  Intimate Partner Violence: Not on file    FAMILY HISTORY: Family History  Problem Relation Age of Onset  . Diabetes Mother     ALLERGIES:  is allergic to penicillins, shellfish allergy, and metronidazole.  MEDICATIONS:  Current Outpatient Medications  Medication Sig Dispense Refill  . Azelastine HCl 137 MCG/SPRAY SOLN Place 2 sprays into the nose as directed.    . cetirizine (ZYRTEC) 10 MG tablet Take 1 tablet (10 mg total) by mouth daily. 30 tablet 11  . fluconazole (DIFLUCAN) 150 MG tablet Take 1 tablet of  day 1, then take 1 tablet on day 8 2 tablet 0  . fluticasone (FLONASE) 50 MCG/ACT nasal spray Place 2 sprays into both nostrils 2 (two) times daily. 16 g 6  . ibuprofen (ADVIL) 800 MG tablet Take 1 tablet (800 mg total) by mouth 3 (three) times daily. 21 tablet 0  . magic mouthwash (nystatin, lidocaine, diphenhydrAMINE) suspension Take 5 mLs by mouth 3 (three) times daily as needed for mouth pain. Swish and spit 3 times daily as needed 180 mL 0  . omeprazole (PRILOSEC) 20 MG capsule Take 1 capsule (20 mg total) by mouth daily. 30 capsule 3  . polyethylene glycol (MIRALAX / GLYCOLAX) 17 g packet Take 17 g by mouth daily.    Marland Kitchen sulfamethoxazole-trimethoprim (BACTRIM DS) 800-160 MG tablet Take 1 tablet by mouth 2 (two) times daily. 20 tablet 0  . TRI-ESTARYLLA 0.18/0.215/0.25 MG-35 MCG tablet Take 1 tablet by mouth daily.     No current facility-administered medications for this visit.    REVIEW OF SYSTEMS:   All other systems are reviewed and are negative for acute change except as noted in the HPI.  PHYSICAL EXAMINATION: ECOG PERFORMANCE STATUS: {CHL ONC ECOG PS:[id number]}  There were no vitals filed for this visit. There were no vitals filed for this visit.  Physical Exam    LABORATORY DATA:  I have reviewed the data as listed    Latest Ref Rng & Units 06/21/2023    9:36 AM 06/12/2023    3:03 PM 01/08/2023    5:08 PM  CBC  WBC 4.0 - 10.5 K/uL 3.9  3.9  5.4   Hemoglobin 12.0 - 15.0 g/dL 96.0  45.4  09.8   Hematocrit 36.0 - 46.0 % 40.7  40.5  43.3   Platelets 150 - 400 K/uL 362  317  320        Latest Ref Rng & Units 06/21/2023    9:36 AM 06/12/2023    3:03 PM 01/08/2023    5:08 PM  CMP  Glucose 70 - 99 mg/dL 78  119  83   BUN 6 - 20 mg/dL 13  11  19    Creatinine 0.44 - 1.00 mg/dL 1.47  8.29  5.62   Sodium 135 - 145 mmol/L 138  138  140   Potassium 3.5 - 5.1 mmol/L 4.1  4.1  4.3   Chloride 98 - 111 mmol/L 105  103  106   CO2 22 - 32 mmol/L 29  23  27    Calcium 8.9 - 10.3  mg/dL 8.9  8.6  9.8   Total Protein 6.0 - 8.5 g/dL  6.4    Total Bilirubin 0.0 - 1.2 mg/dL  <1.3    Alkaline Phos 44 - 121 IU/L  61    AST 0 - 40 IU/L  14    ALT 0 - 32 IU/L  10       RADIOGRAPHIC STUDIES: I have personally reviewed  the radiological images as listed and agreed with the findings in the report. CT Soft Tissue Neck W Contrast Result Date: 06/21/2023 CLINICAL DATA:  Bilateral neck swelling. Persistent lymphadenopathy. Patient reports neck swelling for 2 months. Patient was treated with antibiotics following an urgent care visit 01/15. EXAM: CT NECK WITH CONTRAST TECHNIQUE: Multidetector CT imaging of the neck was performed using the standard protocol following the bolus administration of intravenous contrast. RADIATION DOSE REDUCTION: This exam was performed according to the departmental dose-optimization program which includes automated exposure control, adjustment of the mA and/or kV according to patient size and/or use of iterative reconstruction technique. CONTRAST:  75mL OMNIPAQUE IOHEXOL 300 MG/ML  SOLN COMPARISON:  CT neck with contrast 09/20/2016. FINDINGS: Pharynx and larynx: Adenoid tissue is within normal limits. Edematous changes are present within the soft palate the palatine tonsils are enlarged bilaterally and somewhat heterogeneous. No discrete abscess is present. The parapharyngeal fat is clear. Oropharyngeal airway is patent. Vallecula and epiglottis are within normal limits. Aryepiglottic folds and piriform sinuses are clear. Vocal cords are midline and symmetric. Trachea is clear. Salivary glands: The submandibular and parotid glands and ducts are within normal limits. Thyroid: Normal Lymph nodes: Prominent level 2 lymph nodes bilaterally are likely reactive, left greater than right. Prominent left level 3 nodes are also present. No necrotic or suppurative nodes are present. Vascular: No focal vascular lesions are present. Limited intracranial: Within normal limits.  Visualized orbits: The globes and orbits are within normal limits. Mastoids and visualized paranasal sinuses: A polyp or retention cyst is present in the right maxillary sinus. The paranasal sinuses and mastoid air cells are clear otherwise. Skeleton: Vertebral body heights are normal. Straightening of the normal cervical lordosis is present. No focal osseous lesions are present. Upper chest: The lung apices are clear. The thoracic inlet is within normal limits. IMPRESSION: 1. Enlarged and heterogeneous palatine tonsils bilaterally compatible with acute tonsillitis. 2. No discrete abscess. 3. Prominent level 2 lymph nodes bilaterally are likely reactive, left greater than right. Prominent left level 3 nodes are also present. No necrotic or suppurative nodes are present. These nodes all appear reactive. 4. Polyp or retention cyst in the right maxillary sinus. Electronically Signed   By: Marin Roberts M.D.   On: 06/21/2023 11:24    ASSESSMENT & PLAN Barbara Zimmerman is a 33 y.o. female presenting [facility] for consultation regarding lymphadenopathy. We have reviewed etiologies including ***. Patient will proceed with laboratory workup today.   #  #Age related screenings -  -Patient will RTC when work up is complete.  Patient expressed understanding of the recommended workup and is agreeable to move forward.   All questions were answered. The patient knows to call the clinic with any problems, questions or concerns.  Shared visit with Dr. Leonides Schanz  No orders of the defined types were placed in this encounter.     I have spent a total of {CHL ONC TIME VISIT - [id number]} minutes of face-to-face and non-face-to-face time, preparing to see the patient, obtaining and/or reviewing separately obtained history, performing a medically appropriate examination, counseling and educating the patient, ordering medications/tests/procedures, referring and communicating with other  health care professionals, documenting clinical information in the electronic health record, independently interpreting results and communicating results to the patient, and care coordination.   Namon Cirri PA-C Department of Hematology/Oncology [facility] at [facility] Phone: [phone]

## 2023-07-15 ENCOUNTER — Encounter: Payer: Self-pay | Admitting: Medical Oncology

## 2023-07-15 ENCOUNTER — Inpatient Hospital Stay: Payer: Medicaid Other

## 2023-07-15 ENCOUNTER — Inpatient Hospital Stay: Payer: Medicaid Other | Attending: Physician Assistant | Admitting: Physician Assistant

## 2023-07-15 DIAGNOSIS — M79643 Pain in unspecified hand: Secondary | ICD-10-CM | POA: Insufficient documentation

## 2023-07-15 DIAGNOSIS — M7989 Other specified soft tissue disorders: Secondary | ICD-10-CM | POA: Insufficient documentation

## 2023-07-15 DIAGNOSIS — Z833 Family history of diabetes mellitus: Secondary | ICD-10-CM | POA: Insufficient documentation

## 2023-07-15 DIAGNOSIS — J45909 Unspecified asthma, uncomplicated: Secondary | ICD-10-CM | POA: Insufficient documentation

## 2023-07-15 DIAGNOSIS — R609 Edema, unspecified: Secondary | ICD-10-CM | POA: Insufficient documentation

## 2023-07-15 DIAGNOSIS — R591 Generalized enlarged lymph nodes: Secondary | ICD-10-CM | POA: Insufficient documentation

## 2023-07-15 DIAGNOSIS — Z880 Allergy status to penicillin: Secondary | ICD-10-CM | POA: Insufficient documentation

## 2023-07-15 DIAGNOSIS — J353 Hypertrophy of tonsils with hypertrophy of adenoids: Secondary | ICD-10-CM | POA: Insufficient documentation

## 2023-07-15 DIAGNOSIS — R221 Localized swelling, mass and lump, neck: Secondary | ICD-10-CM | POA: Insufficient documentation

## 2023-07-15 DIAGNOSIS — R61 Generalized hyperhidrosis: Secondary | ICD-10-CM | POA: Insufficient documentation

## 2023-07-15 DIAGNOSIS — Z881 Allergy status to other antibiotic agents status: Secondary | ICD-10-CM | POA: Insufficient documentation

## 2023-07-15 DIAGNOSIS — R202 Paresthesia of skin: Secondary | ICD-10-CM | POA: Insufficient documentation

## 2023-07-15 DIAGNOSIS — Z79899 Other long term (current) drug therapy: Secondary | ICD-10-CM | POA: Insufficient documentation

## 2023-07-15 DIAGNOSIS — Z87891 Personal history of nicotine dependence: Secondary | ICD-10-CM | POA: Insufficient documentation

## 2023-07-15 NOTE — Progress Notes (Signed)
[facility]  Outgoing call: 0930  LVMOM with patient regarding missed 0900 appt this morning. Call back number provided to rescheduled.   Rexene Edison, RN, BSN, [facility] Nurse Navigator, [facility] 07/15/2023 9:31 AM

## 2023-07-15 NOTE — Progress Notes (Unsigned)
[facility] [phone]   [phone]  INITIAL CONSULTATION:  Patient Care Team: [facility], [facility] as PCP - General (Pediatrics)  CHIEF COMPLAINTS/PURPOSE OF CONSULTATION:  "Lymphadenopathy"  HISTORY OF PRESENTING ILLNESS:  Barbara Zimmerman 33 y.o. female with medical history significant for juvenile arthritis and childhood asthma.  On review of the previous records patient presented to [facility] 06/12/23 for nodules on the left side of her neck that started in November 2024 (approx 2 months). Provider noted patient had an aphthous ulcer and left submandibular, cervical, and superficial adenopathy on exam. CBC and CMP were overall unremarkable. POCT rapid strep and mono screenings were negative. She was treated with a course of doxycycline.  The swelling continued and prompted an ED visit on 06/21/23. CT neck obtained and showed enlarged and heterogeneous palatine tonsils bilaterally compatible with acute tonsillitis without abscess. Prominent level 2 lymph nodes bilaterally left greater than right. Prominent left level 3 nodes are also present. There were no necrotic or suppurative nodes are present. Radiologist adds that these nodes all appear reactive. CBC with mild leukopenia 3.9. ED provider referred to ENT and treated with a course of clindamycin. ENT consult on 07/10/23. Patient had flexible fiberoptic laryngoscopy in clinic that was unrevealing. She was prescribed a course of bactrim for possible chronic sinusitis. Referral sent to rheumatologist for potential relapse with the burning and tingling sensation in her hands and to oncology to determine any risk factors for hematologic malignancy. Plan also included considering excisional lymph node biopsy if symptoms do not improve.   On exam today patient reports her symptoms are improved. About a week ago, before starting the bactrim, the swollen lymph nodes were  noticeably smaller in size and the night sweats and body aches had resolved. She will finish course of Bactrim this week. Patient states has a history of juvenile arthritis, which was recently disclosed to her by her mother. She reports her hands locking, burning, and tingling, with pain radiating from her fingertips upwards. These symptoms are worse in the morning and at night, with swelling noted in her hands and fingers. She plans to follow up with rheumatology for this.  Patient reports family history of malignancy with 2 maternal aunts having breast and lung cancer and a maternal uncle with lung cancer. Patent quit smoking x 2 months ago and admits to smoking for 15 years. She does not consume alcohol.   MEDICAL HISTORY:  Past Medical History:  Diagnosis Date   Asthma    childhood   Post partum depression    Tonsillitis     SURGICAL HISTORY: Past Surgical History:  Procedure Laterality Date   ABDOMINAL SURGERY     c secion     CESAREAN SECTION      SOCIAL HISTORY: Social History   Socioeconomic History   Marital status: Single    Spouse name: Not on file   Number of children: Not on file   Years of education: Not on file   Highest education level: Not on file  Occupational History   Not on file  Tobacco Use   Smoking status: Former    Current packs/day: 0.50    Average packs/day: 0.5 packs/day for 5.0 years (2.5 ttl pk-yrs)    Types: Cigarettes   Smokeless tobacco: Never  Vaping Use   Vaping status: Never Used  Substance and Sexual Activity   Alcohol use: Not Currently   Drug use: No   Sexual activity: Yes  Partners: Male    Birth control/protection: Pill, None  Other Topics Concern   Not on file  Social History Narrative   Not on file   Social Drivers of Health   Financial Resource Strain: Not at Risk (08/20/2022)   Received from [place], [employer]    Financial Resource Strain: 1  Food Insecurity: Not at Risk (08/20/2022)   Received  from [place], [place]   Food Insecurity    Food: 1  Transportation Needs: Not at Risk (08/20/2022)   Received from [place], [employer] Needs    Transportation: 1  Physical Activity: Not on File (09/14/2021)   Received from [place], [place]   Physical Activity    Physical Activity: 0  Stress: Not on File (09/14/2021)   Received from [facility], [place]   Stress    Stress: 0  Social Connections: Not on File (02/03/2023)   Received from [employer]   Social Connections    Connectedness: 0  Intimate Partner Violence: Not on file    FAMILY HISTORY: Family History  Problem Relation Age of Onset   Diabetes Mother     ALLERGIES:  is allergic to penicillins, shellfish allergy, and metronidazole.  MEDICATIONS:  Current Outpatient Medications  Medication Sig Dispense Refill   omeprazole (PRILOSEC) 20 MG capsule Take 1 capsule (20 mg total) by mouth daily. 30 capsule 3   sulfamethoxazole-trimethoprim (BACTRIM DS) 800-160 MG tablet Take 1 tablet by mouth 2 (two) times daily. 20 tablet 0   TRI-ESTARYLLA 0.18/0.215/0.25 MG-35 MCG tablet Take 1 tablet by mouth daily.     cetirizine (ZYRTEC) 10 MG tablet Take 1 tablet (10 mg total) by mouth daily. (Patient not taking: Reported on 07/16/2023) 30 tablet 11   fluticasone (FLONASE) 50 MCG/ACT nasal spray Place 2 sprays into both nostrils 2 (two) times daily. (Patient not taking: Reported on 07/16/2023) 16 g 6   ibuprofen (ADVIL) 800 MG tablet Take 1 tablet (800 mg total) by mouth 3 (three) times daily. (Patient not taking: Reported on 07/16/2023) 21 tablet 0   No current facility-administered medications for this visit.    REVIEW OF SYSTEMS:   All other systems are reviewed and are negative for acute change except as noted in the HPI.  PHYSICAL EXAMINATION: ECOG PERFORMANCE STATUS: 1 - Symptomatic but completely ambulatory  Vitals:   07/16/23 1305  BP: 122/72  Pulse: 80  Resp: 20  Temp: 98.2 F (36.8 C)  SpO2: 100%   Filed Weights   07/16/23 1305   Weight: 238 lb (108 kg)    Physical Exam Vitals reviewed.  HENT:     Head: Normocephalic.     Right Ear: External ear normal.     Left Ear: External ear normal.     Nose: Nose normal.  Eyes:     Conjunctiva/sclera: Conjunctivae normal.  Cardiovascular:     Rate and Rhythm: Normal rate and regular rhythm.     Pulses: Normal pulses.     Heart sounds: Normal heart sounds.  Pulmonary:     Effort: Pulmonary effort is normal.     Breath sounds: Normal breath sounds.  Abdominal:     General: There is no distension.     Tenderness: There is no abdominal tenderness.  Musculoskeletal:        General: Normal range of motion.     Cervical back: Normal range of motion.  Lymphadenopathy:     Cervical:     Right cervical: No superficial cervical adenopathy.  Left cervical: No superficial cervical adenopathy.     Upper Body:     Right upper body: No supraclavicular or axillary adenopathy.     Left upper body: No supraclavicular or axillary adenopathy.     Lower Body: No right inguinal adenopathy. No left inguinal adenopathy.     Comments: Palpable left level 2-3 lymphadenopathy without tenderness to palpation. No overlying skin changes  Skin:    General: Skin is warm.  Neurological:     Mental Status: She is alert.       LABORATORY DATA:  I have reviewed the data as listed    Latest Ref Rng & Units 07/16/2023    2:49 PM 06/21/2023    9:36 AM 06/12/2023    3:03 PM  CBC  WBC 4.0 - 10.5 K/uL 4.2  3.9  3.9   Hemoglobin 12.0 - 15.0 g/dL 81.1  91.4  78.2   Hematocrit 36.0 - 46.0 % 39.7  40.7  40.5   Platelets 150 - 400 K/uL 452  362  317        Latest Ref Rng & Units 07/16/2023    2:49 PM 06/21/2023    9:36 AM 06/12/2023    3:03 PM  CMP  Glucose 70 - 99 mg/dL 87  78  956   BUN 6 - 20 mg/dL 12  13  11    Creatinine 0.44 - 1.00 mg/dL 2.13  0.86  5.78   Sodium 135 - 145 mmol/L 137  138  138   Potassium 3.5 - 5.1 mmol/L 4.1  4.1  4.1   Chloride 98 - 111 mmol/L 105  105  103    CO2 22 - 32 mmol/L 28  29  23    Calcium 8.9 - 10.3 mg/dL 9.3  8.9  8.6   Total Protein 6.5 - 8.1 g/dL 7.5   6.4   Total Bilirubin 0.0 - 1.2 mg/dL 0.4   <4.6   Alkaline Phos 38 - 126 U/L 60   61   AST 15 - 41 U/L 13   14   ALT 0 - 44 U/L 14   10      RADIOGRAPHIC STUDIES: I have personally reviewed the radiological images as listed and agreed with the findings in the report. CT Soft Tissue Neck W Contrast Result Date: 06/21/2023 CLINICAL DATA:  Bilateral neck swelling. Persistent lymphadenopathy. Patient reports neck swelling for 2 months. Patient was treated with antibiotics following an urgent care visit 01/15. EXAM: CT NECK WITH CONTRAST TECHNIQUE: Multidetector CT imaging of the neck was performed using the standard protocol following the bolus administration of intravenous contrast. RADIATION DOSE REDUCTION: This exam was performed according to the departmental dose-optimization program which includes automated exposure control, adjustment of the mA and/or kV according to patient size and/or use of iterative reconstruction technique. CONTRAST:  75mL OMNIPAQUE IOHEXOL 300 MG/ML  SOLN COMPARISON:  CT neck with contrast 09/20/2016. FINDINGS: Pharynx and larynx: Adenoid tissue is within normal limits. Edematous changes are present within the soft palate the palatine tonsils are enlarged bilaterally and somewhat heterogeneous. No discrete abscess is present. The parapharyngeal fat is clear. Oropharyngeal airway is patent. Vallecula and epiglottis are within normal limits. Aryepiglottic folds and piriform sinuses are clear. Vocal cords are midline and symmetric. Trachea is clear. Salivary glands: The submandibular and parotid glands and ducts are within normal limits. Thyroid: Normal Lymph nodes: Prominent level 2 lymph nodes bilaterally are likely reactive, left greater than right. Prominent left level 3 nodes are also  present. No necrotic or suppurative nodes are present. Vascular: No focal vascular  lesions are present. Limited intracranial: Within normal limits. Visualized orbits: The globes and orbits are within normal limits. Mastoids and visualized paranasal sinuses: A polyp or retention cyst is present in the right maxillary sinus. The paranasal sinuses and mastoid air cells are clear otherwise. Skeleton: Vertebral body heights are normal. Straightening of the normal cervical lordosis is present. No focal osseous lesions are present. Upper chest: The lung apices are clear. The thoracic inlet is within normal limits. IMPRESSION: 1. Enlarged and heterogeneous palatine tonsils bilaterally compatible with acute tonsillitis. 2. No discrete abscess. 3. Prominent level 2 lymph nodes bilaterally are likely reactive, left greater than right. Prominent left level 3 nodes are also present. No necrotic or suppurative nodes are present. These nodes all appear reactive. 4. Polyp or retention cyst in the right maxillary sinus. Electronically Signed   By: Marin Roberts M.D.   On: 06/21/2023 11:24    ASSESSMENT & PLAN Barbara Zimmerman is a 33 y.o. female presenting to the [facility] for consultation regarding lymphadenopathy. We have reviewed etiologies including infectious process, inflammatory process, malignancy. Patient will proceed with laboratory workup today.   #Lymphadenopathy -Persistent for several months, with some improvement noted on multiple courses of antibiotics. No associated fevers but reported night sweats. -Chart review shows patient has recent testing to include HIV TSH/T4 and POCT mono and strep testing. All were negative. Labs collected today include CBC, CMP, ESR, CRP, LDH, flow cytometry, and Hep B & C serologies. -Will obtain CT CAP to evaluate for additional lymphadenopathy    #?History of Juvenile Arthritis -Self reported history of juvenile arthritis with recent symptoms of hand locking, burning, and tingling. Noted morning stiffness and intermittent swelling of  hand joints. -Was referred to referred to rheumatology by ENT and appointment was scheduled for 11/2023 for further evaluation and management of these symptoms. Will attempt to expedite appointment due to symptom severity.    #Age related screenings -Due for cervical cancer screening. Advised to follow up with PCP or gyn for this.  -Patient will RTC when work up is complete.  Patient expressed understanding of the recommended workup and is agreeable to move forward.   All questions were answered. The patient knows to call the clinic with any problems, questions or concerns.  Shared visit with Dr. Leonides Schanz  Orders Placed This Encounter  Procedures   CT CHEST ABDOMEN PELVIS W CONTRAST    Standing Status:   Future    Expiration Date:   07/14/2024    If indicated for the ordered procedure, I authorize the administration of contrast media per Radiology protocol:   Yes    Does the patient have a contrast media/X-ray dye allergy?:   No    Is patient pregnant?:   No    Preferred imaging location?:   [facility]    If indicated for the ordered procedure, I authorize the administration of oral contrast media per Radiology protocol:   Yes   CBC with Differential (Cancer Center Only)   CMP (Cancer Center only)   C-reactive protein   Flow Cytometry, Peripheral Blood (Oncology)   Hepatitis B core antibody, total   Hepatitis B surface antibody,qualitative   Hepatitis B surface antigen   Hepatitis C antibody   Lactate dehydrogenase   Sedimentation rate   ANA, IFA (with reflex)    Standing Status:   Future    Number of Occurrences:   1  Expected Date:   07/16/2023    Expiration Date:   07/15/2024      I have spent a total of 60 minutes minutes of face-to-face and non-face-to-face time, preparing to see the patient, obtaining and/or reviewing separately obtained history, performing a medically appropriate examination, counseling and educating the patient, ordering  medications/tests/procedures, referring and communicating with other health care professionals, documenting clinical information in the electronic health record, independently interpreting results and communicating results to the patient, and care coordination.   Namon Cirri PA-C Department of Hematology/Oncology [facility] at [facility] Phone: [phone]  I have read the above note and personally examined the patient. I agree with the assessment and plan as noted above.  Briefly Barbara Zimmerman is a 33 year old female who presents for evaluation of left-sided cervical adenopathy.  The patient has had lymphadenopathy since November 2024.  She notes that it was initially tender enlarged but has decreased in size.  She has gone through multiple rounds of antibiotics without resolution of the lymph nodes.  She has been evaluated by ENT as well.  The lymph nodes appear limited to the left cervical chain.  In order to assure there are no other sites of lymphadenopathy will order CT chest abdomen pelvis.  Additionally we will order inflammatory markers with ESR and CRP as well as hepatitis B, C, and HIV testing.  At this time does appear that lymph nodes are reactive, however they have been present for nearly 3 months.  In the event that our CT scan that shows persistent lymphadenopathy or if the lymph nodes fail to resolve would recommend considering FNA biopsy in order to rule out underlying malignancy.  The patient voiced understanding of our findings and plan moving forward.   Ulysees Barns, MD Department of Hematology/Oncology [facility] at [facility] Phone: [phone] Pager: [phone] Email: [email or web address]

## 2023-07-15 NOTE — Progress Notes (Signed)
[facility]  Patient called to reschedule her missed appointment this morning. Patient rescheduled for 2/18 at 12:30 with Karie Fetch. PA-C. Patient confirms appt and denies questions at this time.  Patient encouraged to call for questions. Patient requested a reminder, MyChart message will be sent.   Rexene Edison, RN, BSN, [facility] On[facility] Nurse Navigator, [facility] 07/15/2023 11:19 AM

## 2023-07-16 ENCOUNTER — Encounter: Payer: Self-pay | Admitting: Medical Oncology

## 2023-07-16 ENCOUNTER — Inpatient Hospital Stay (HOSPITAL_BASED_OUTPATIENT_CLINIC_OR_DEPARTMENT_OTHER): Payer: Medicaid Other | Admitting: Physician Assistant

## 2023-07-16 ENCOUNTER — Inpatient Hospital Stay: Payer: Medicaid Other

## 2023-07-16 VITALS — BP 122/72 | HR 80 | Temp 98.2°F | Resp 20 | Wt 238.0 lb

## 2023-07-16 DIAGNOSIS — M7989 Other specified soft tissue disorders: Secondary | ICD-10-CM | POA: Diagnosis not present

## 2023-07-16 DIAGNOSIS — Z87891 Personal history of nicotine dependence: Secondary | ICD-10-CM | POA: Diagnosis not present

## 2023-07-16 DIAGNOSIS — Z833 Family history of diabetes mellitus: Secondary | ICD-10-CM | POA: Diagnosis not present

## 2023-07-16 DIAGNOSIS — R591 Generalized enlarged lymph nodes: Secondary | ICD-10-CM

## 2023-07-16 DIAGNOSIS — Z881 Allergy status to other antibiotic agents status: Secondary | ICD-10-CM | POA: Diagnosis not present

## 2023-07-16 DIAGNOSIS — R202 Paresthesia of skin: Secondary | ICD-10-CM | POA: Diagnosis not present

## 2023-07-16 DIAGNOSIS — M79643 Pain in unspecified hand: Secondary | ICD-10-CM | POA: Diagnosis not present

## 2023-07-16 DIAGNOSIS — R609 Edema, unspecified: Secondary | ICD-10-CM | POA: Diagnosis not present

## 2023-07-16 DIAGNOSIS — R61 Generalized hyperhidrosis: Secondary | ICD-10-CM | POA: Diagnosis not present

## 2023-07-16 DIAGNOSIS — J353 Hypertrophy of tonsils with hypertrophy of adenoids: Secondary | ICD-10-CM | POA: Diagnosis not present

## 2023-07-16 DIAGNOSIS — Z79899 Other long term (current) drug therapy: Secondary | ICD-10-CM | POA: Diagnosis not present

## 2023-07-16 DIAGNOSIS — Z880 Allergy status to penicillin: Secondary | ICD-10-CM | POA: Diagnosis not present

## 2023-07-16 DIAGNOSIS — J45909 Unspecified asthma, uncomplicated: Secondary | ICD-10-CM | POA: Diagnosis not present

## 2023-07-16 DIAGNOSIS — R221 Localized swelling, mass and lump, neck: Secondary | ICD-10-CM | POA: Diagnosis not present

## 2023-07-16 LAB — CBC WITH DIFFERENTIAL (CANCER CENTER ONLY)
Abs Immature Granulocytes: 0 10*3/uL (ref 0.00–0.07)
Basophils Absolute: 0 10*3/uL (ref 0.0–0.1)
Basophils Relative: 0 %
Eosinophils Absolute: 0.3 10*3/uL (ref 0.0–0.5)
Eosinophils Relative: 7 %
HCT: 39.7 % (ref 36.0–46.0)
Hemoglobin: 12.8 g/dL (ref 12.0–15.0)
Immature Granulocytes: 0 %
Lymphocytes Relative: 53 %
Lymphs Abs: 2.2 10*3/uL (ref 0.7–4.0)
MCH: 28 pg (ref 26.0–34.0)
MCHC: 32.2 g/dL (ref 30.0–36.0)
MCV: 86.9 fL (ref 80.0–100.0)
Monocytes Absolute: 0.2 10*3/uL (ref 0.1–1.0)
Monocytes Relative: 5 %
Neutro Abs: 1.5 10*3/uL — ABNORMAL LOW (ref 1.7–7.7)
Neutrophils Relative %: 35 %
Platelet Count: 452 10*3/uL — ABNORMAL HIGH (ref 150–400)
RBC: 4.57 MIL/uL (ref 3.87–5.11)
RDW: 13 % (ref 11.5–15.5)
WBC Count: 4.2 10*3/uL (ref 4.0–10.5)
nRBC: 0 % (ref 0.0–0.2)

## 2023-07-16 LAB — CMP (CANCER CENTER ONLY)
ALT: 14 U/L (ref 0–44)
AST: 13 U/L — ABNORMAL LOW (ref 15–41)
Albumin: 3.9 g/dL (ref 3.5–5.0)
Alkaline Phosphatase: 60 U/L (ref 38–126)
Anion gap: 4 — ABNORMAL LOW (ref 5–15)
BUN: 12 mg/dL (ref 6–20)
CO2: 28 mmol/L (ref 22–32)
Calcium: 9.3 mg/dL (ref 8.9–10.3)
Chloride: 105 mmol/L (ref 98–111)
Creatinine: 0.71 mg/dL (ref 0.44–1.00)
GFR, Estimated: >60 mL/min (ref >60)
Glucose, Bld: 87 mg/dL (ref 70–99)
Potassium: 4.1 mmol/L (ref 3.5–5.1)
Sodium: 137 mmol/L (ref 135–145)
Total Bilirubin: 0.4 mg/dL (ref 0.0–1.2)
Total Protein: 7.5 g/dL (ref 6.5–8.1)

## 2023-07-16 LAB — HEPATITIS C ANTIBODY: HCV Ab: NONREACTIVE

## 2023-07-16 LAB — LACTATE DEHYDROGENASE: LDH: 139 U/L (ref 98–192)

## 2023-07-16 LAB — SEDIMENTATION RATE: Sed Rate: 40 mm/h — ABNORMAL HIGH (ref 0–22)

## 2023-07-16 LAB — C-REACTIVE PROTEIN: CRP: 1 mg/dL — ABNORMAL HIGH (ref <1.0)

## 2023-07-16 LAB — HEPATITIS B SURFACE ANTIGEN: Hepatitis B Surface Ag: NONREACTIVE

## 2023-07-16 NOTE — Patient Instructions (Signed)
Diagnostic Clinic Office Visit Discharge Information and Instructions  Thank you for choosing [facility] for your healthcare needs.  Below is a summary of today's discussion, along with our contact information and an outline of what to expect next.  Reason for Visit:  enlarged lymph nodes in neck  Proposed Diagnostic Care Plan: Labs collected today CT scan of your chest, abdomen and pelvis ordered. We are working on Facilities manager. Once we tell you that it is approved, please call the Central Scheduling Department to schedule your scan. The phone number is [phone]. The scan can happen at any of the [facility] facilities. I will call you once I have the scan results.   What to Expect: - Generally, when lab tests are ordered the results can take up to 1 week for results to be available.  At that point, we will contact you to discuss your results with you.  Unless there is a critical result, we will typically wait for all of your lab results to be available before contacting you. - If a biopsy is part of your Care Plan, those results can take on average 7-10 days to result.  Once results are available, we will contact you to discuss your pathology results and any next steps. - If you have additional imaging ordered, such as a CT Scan, MRI, Ultrasound, Bone Scan, or PET scan, your imaging will need to be authorized then scheduled with the earliest available appointment.  You may be asked to travel to another hospital within [facility] who has a sooner availability, please consider doing so if asked. - If you use MyChart, your results will be available to you in the MyChart portal.  Your provider will be in touch with you as soon as all of your results are available to be discussed.  Your Diagnostic Clinic Provider:  Namon Cirri PA-C and Dr. Leonides Schanz Your Diagnostic Navigator:  Chauncy Lean RN, office number [phone]  If you or your caregiver have number blocking on your  cell phones, please ensure the cancer center's numbers are not blocked.  If you are not a registered MyChart user, please consider enrolling in MyChart to receive your test results and visit notes.  You can also access your discharge instructions electronically.  MyChart also gives you an electronic means to communicate with your Care Team instead of needing to call in to the cancer center.  We appreciate you trusting [place] with your healthcare and look forward to partnering with you as we work to uncover what your potential diagnosis may be.  Please do not hesitate to reach out at any point with questions or concerns.

## 2023-07-16 NOTE — Progress Notes (Signed)
Reminder call to patient for 12:30 appointment today with Karie Fetch. PA-C. Patient was asked to come fifteen minutes early for registration. Patient confirms, denies questions. Directions provided.  Rexene Edison, RN, BSN, [facility] Oncology Nurse Navigator, [facility] 07/16/2023 10:09 AM

## 2023-07-16 NOTE — Progress Notes (Signed)
[facility]  Met with patient, who presented alone, during her scheduled appointment with Daphane Shepherd PA-C. Patient was provided with my direct contact information and encouraged to call me with questions/concerns.   Rexene Edison, RN, BSN, [facility] Oncology Nurse Navigator, [facility] 07/16/2023 2:39 PM

## 2023-07-17 ENCOUNTER — Encounter: Payer: Self-pay | Admitting: Medical Oncology

## 2023-07-17 LAB — SURGICAL PATHOLOGY

## 2023-07-17 LAB — HEPATITIS B SURFACE ANTIBODY,QUALITATIVE: Hep B S Ab: REACTIVE — AB

## 2023-07-18 LAB — HEPATITIS B CORE ANTIBODY, TOTAL: HEP B CORE AB: NEGATIVE

## 2023-07-18 LAB — FLOW CYTOMETRY

## 2023-07-23 ENCOUNTER — Ambulatory Visit: Payer: Medicaid Other | Attending: Internal Medicine | Admitting: Internal Medicine

## 2023-07-23 ENCOUNTER — Encounter: Payer: Self-pay | Admitting: Internal Medicine

## 2023-07-23 ENCOUNTER — Encounter: Payer: Self-pay | Admitting: Medical Oncology

## 2023-07-23 VITALS — BP 106/72 | HR 69 | Resp 14 | Ht 67.0 in | Wt 242.0 lb

## 2023-07-23 DIAGNOSIS — Z8739 Personal history of other diseases of the musculoskeletal system and connective tissue: Secondary | ICD-10-CM | POA: Insufficient documentation

## 2023-07-23 DIAGNOSIS — M79642 Pain in left hand: Secondary | ICD-10-CM | POA: Diagnosis not present

## 2023-07-23 DIAGNOSIS — M79641 Pain in right hand: Secondary | ICD-10-CM | POA: Diagnosis not present

## 2023-07-23 DIAGNOSIS — G5603 Carpal tunnel syndrome, bilateral upper limbs: Secondary | ICD-10-CM | POA: Insufficient documentation

## 2023-07-23 LAB — ANTINUCLEAR ANTIBODIES, IFA: ANA Ab, IFA: NEGATIVE

## 2023-07-23 NOTE — Progress Notes (Signed)
 [facility]  Outgoing call to patient and informed her, per PA-C Georga Kaufmann, that patient's ANA  is negative. Patient saw Dr. Hyman Hopes this morning and he had ordered additional labs. All patient's questions answered to her satisfaction. Patient encouraged to call with questions/concerns.   Rexene Edison, RN, BSN, [facility] Oncology Nurse Navigator, [facility] 07/23/2023 2:29 PM

## 2023-07-23 NOTE — Progress Notes (Signed)
 Office Visit Note  Patient: Barbara Zimmerman             Date of Birth: Jan 06, 1991           MRN: [id number]             PCP: [facility] Referring: Ashok Croon, MD Visit Date: 07/23/2023   Subjective:  New Patient (Initial Visit) (Patient states her hands have been tingling and burning. Patient states her hands will lock up sometimes. Patient states the pain from her hands can travel up past her forearms. Patient states she has numbness and tingling. )   Discussed the use of AI scribe software for clinical note transcription with the patient, who gave verbal consent to proceed.  History of Present Illness   Barbara Zimmerman is a 33 year old female who presents with hand pain and numbness. She was referred by an ENT specialist for evaluation of hand symptoms new in the context of sore throat and lymphadenopathy lasting a few months. She has a reported history of JRA but does not recall details of this.  She has been experiencing significant hand pain and numbness for approximately one month. The symptoms began suddenly in January, with her hands feeling as if they were on fire and locked, particularly affecting her dominant hand. She is unable to close a fist, and the pain radiates from her hand to the middle of her forearm. The symptoms include tingling and burning sensations, occurring in the morning, at night, and intermittently throughout the day.  The onset of her hand symptoms coincided with swollen lymph nodes, cold chills, body aches, and fatigue, although she did not have a fever. While the lymph nodes have improved following antibiotic treatment, the hand symptoms persist. No previous similar issues with her hands or feet. She reports visible swelling, redness, and discoloration in her hands, with dryness and a rash-like appearance on one hand. The symptoms affect all fingers, with varying intensity. No dropping objects due to numbness or locking, as she  avoids using her hands when they are locked. The symptoms occasionally wake her at night.  She recalls a past incident where she injured her right 4th finger while lifting a couch, which resulted in swelling and required a ring to be cut off. There is some persistent swelling at the joint. But she did not recall any injury involving other fingers and the let hand.   She has been using a wrist brace and has tried ibuprofen and Aleve as needed for pain relief, but she is cautious with these medications due to her history of GERD and gastritis. She has not experienced any pain in the shoulder or other joints.  Her medical history includes GERD and gastritis, which limits her use of oral anti-inflammatory medications. She also mentions a past episode of Bell's palsy.  Her mother has a history of carpal tunnel syndrome.  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   Labs reviewed 06/2023 ANA neg ESR 40 CRP 1.0 HBV/HCV immune/negative  Activities of Daily Living:  Patient reports morning stiffness for 20-30 minutes.   Patient Reports nocturnal pain.  Difficulty dressing/grooming: Reports Difficulty climbing stairs: Denies Difficulty getting out of chair: Denies Difficulty using hands for taps, buttons, cutlery, and/or writing: Reports  Review of Systems  Constitutional:  Negative for fatigue.  HENT:  Negative for mouth sores and mouth dryness.   Eyes:  Negative for dryness.  Respiratory:  Negative for shortness of breath.   Cardiovascular:  Negative for chest pain and palpitations.  Gastrointestinal:  Negative for blood in stool, constipation and diarrhea.  Endocrine: Negative for increased urination.  Genitourinary:  Negative for involuntary urination.  Musculoskeletal:  Positive for joint pain, joint pain, joint swelling, myalgias, muscle weakness, morning stiffness, muscle tenderness and myalgias. Negative for gait problem.  Skin:  Positive for  color change and hair loss. Negative for rash and sensitivity to sunlight.  Allergic/Immunologic: Positive for susceptible to infections.  Neurological:  Negative for dizziness and headaches.  Hematological:  Negative for swollen glands.  Psychiatric/Behavioral:  Negative for depressed mood and sleep disturbance. The patient is not nervous/anxious.     PMFS History:  Patient Active Problem List   Diagnosis Date Noted   Bilateral hand pain 07/23/2023   History of juvenile arthritis 07/23/2023   Carpal tunnel syndrome on both sides 07/23/2023    Past Medical History:  Diagnosis Date   Asthma    childhood   Bell palsy    Juvenile rheumatoid arthritis (HCC)    Post partum depression    Tonsillitis     Family History  Problem Relation Age of Onset   Diabetes Mother    Past Surgical History:  Procedure Laterality Date   ABDOMINAL SURGERY     CESAREAN SECTION     Social History   Social History Narrative   Not on file    There is no immunization history on file for this patient.   Objective: Vital Signs: BP 106/72 (BP Location: Right Arm, Patient Position: Sitting, Cuff Size: Large)   Pulse 69   Resp 14   Ht 5\' 7"  (1.702 m)   Wt 242 lb (109.8 kg)   LMP 07/23/2023   BMI 37.90 kg/m    Physical Exam Constitutional:      Appearance: She is obese.  HENT:     Mouth/Throat:     Mouth: Mucous membranes are moist.     Pharynx: Oropharynx is clear.  Eyes:     Conjunctiva/sclera: Conjunctivae normal.  Cardiovascular:     Rate and Rhythm: Normal rate and regular rhythm.  Pulmonary:     Effort: Pulmonary effort is normal.     Breath sounds: Normal breath sounds.  Musculoskeletal:     Right lower leg: No edema.     Left lower leg: No edema.  Lymphadenopathy:     Cervical: No cervical adenopathy.  Skin:    General: Skin is warm and dry.     Findings: No rash.  Neurological:     Mental Status: She is alert.  Psychiatric:        Mood and Affect: Mood normal.       Musculoskeletal Exam: Shoulders full ROM no tenderness or swelling Elbows full ROM no tenderness or swelling Wrists full ROM, very acutely painful with flexion of the right wrist and with percussion with some pain and tingling rating into the fingers and into forearm Fingers full ROM, slight swelling at right fourth PIP joint but without palpable effusion Knees full ROM no tenderness or swelling Ankles full ROM no tenderness or swelling  Limited MSK US exam of bilateral wrist demonstrates median nerve cross-sectional area of approximately 0.14 cm on the right and 0.12 cm on the left.  There is no apparent joint effusion, tenosynovitis, or hyperemia visualized in either wrist.   Investigation: No additional findings.  Imaging: No results found.  Recent Labs: Lab Results  Component Value Date  WBC 4.2 07/16/2023   HGB 12.8 07/16/2023   PLT 452 (H) 07/16/2023   NA 137 07/16/2023   K 4.1 07/16/2023   CL 105 07/16/2023   CO2 28 07/16/2023   GLUCOSE 87 07/16/2023   BUN 12 07/16/2023   CREATININE 0.71 07/16/2023   BILITOT 0.4 07/16/2023   ALKPHOS 60 07/16/2023   AST 13 (L) 07/16/2023   ALT 14 07/16/2023   PROT 7.5 07/16/2023   ALBUMIN 3.9 07/16/2023   CALCIUM 9.3 07/16/2023   GFRAA >60 12/11/2017    Speciality Comments: No specialty comments available.  Procedures:  No procedures performed Allergies: Penicillins, Shellfish allergy, and Metronidazole   Assessment / Plan:     Visit Diagnoses: Bilateral hand pain Carpal Tunnel Syndrome  - Plan: Rheumatoid factor, Cyclic citrul peptide antibody, IgG Bilateral hand pain, numbness, and tingling, worse on the right side. Symptoms began approximately one month ago and are associated with swelling and redness. Ultrasound shows median nerve swelling, more pronounced on the right side. No prior history of similar symptoms. -Recommend nightly wrist splinting and range of motion exercises printed today. -Consider use of topical  diclofenac (Voltaren) for local anti-inflammatory effect. -If symptoms persist after conservative management, consider corticosteroid injection into carpal tunnel. -Consider nerve conduction study if symptoms do not improve with conservative management and injection.  History of juvenile arthritis - Plan: Rheumatoid factor, Cyclic citrul peptide antibody, IgG No peripheral joint synovitis appreciable on exam today.  Cannot exclude possibility and had recent mild elevation in sed rate from 1 week ago. -Check blood work for markers of disease activity given new onset of hand symptoms. -Of low threshold to consider hydroxychloroquine versus trial short-term glucocorticoids if abnormal  Gastroesophageal Reflux Disease (GERD) and Gastritis Limits use of oral non-steroidal anti-inflammatory drugs (NSAIDs). -Recommend use of topical diclofenac (Voltaren) for local anti-inflammatory effect.      Orders: Orders Placed This Encounter  Procedures   Rheumatoid factor   Cyclic citrul peptide antibody, IgG   No orders of the defined types were placed in this encounter.   Follow-Up Instructions: Return in about 5 weeks (around 08/27/2023) for New pt CTS/?RA f/u 4-6wks.   Fuller Plan, MD  Note - This record has been created using [employer].  Chart creation errors have been sought, but may not always  have been located. Such creation errors do not reflect on  the standard of medical care.

## 2023-07-25 LAB — RHEUMATOID FACTOR: Rheumatoid fact SerPl-aCnc: <10 [IU]/mL (ref <14)

## 2023-07-25 LAB — CYCLIC CITRUL PEPTIDE ANTIBODY, IGG: Cyclic Citrullin Peptide Ab: <16 U

## 2023-08-01 ENCOUNTER — Ambulatory Visit (HOSPITAL_COMMUNITY): Admission: RE | Admit: 2023-08-01 | Payer: Medicaid Other | Source: Ambulatory Visit

## 2023-08-02 ENCOUNTER — Encounter: Payer: Self-pay | Admitting: Medical Oncology

## 2023-08-02 NOTE — Progress Notes (Signed)
 [facility]  Call to patient regarding missed CT scan scheduled for yesterday. Patient states she forgot about the appointment. Patient states she is feeling better. I provided patient with the number to reschedule the CT and stats she will reschedule. Patient denies questions at this time. Patient encouraged to call me with questions/concerns.   Gregary Cromer, RN, BSN, [facility] Oncology Nurse Navigator, [facility] 08/02/2023 3:54 PM

## 2023-08-13 ENCOUNTER — Encounter: Payer: Self-pay | Admitting: Medical Oncology

## 2023-08-13 NOTE — Progress Notes (Signed)
 [facility]  Outgoing call  Call to patient to inquire about rescheduling missed CT scan. Patient stated she was on another call and asked for me to call her back in 20 minutes.   Call back in 20 minutes, LVMOM with patient and provided her with the central scheduling number to reschedule missed appt. Patient thanked, call back number provider for questions/concerns.   Gregary Cromer, RN, BSN, [facility] Oncology Nurse Navigator, [facility] 08/13/2023 4:33 PM

## 2023-08-27 ENCOUNTER — Telehealth: Payer: Self-pay | Admitting: Physician Assistant

## 2023-08-27 NOTE — Telephone Encounter (Signed)
 The patient to discuss scheduling of CT chest abdomen and pelvis.  Patient canceled the original appointment on 08/01/2023.  She states her children have multiple things going on right now and she needs to plan the scan for their spring break.  Patient transferred to central scheduling department and was able to go ahead and schedule this CT chest abdomen pelvis 09/04/2023.  I will follow-up with patient once results are available.

## 2023-09-04 ENCOUNTER — Ambulatory Visit (HOSPITAL_COMMUNITY): Attending: Physician Assistant

## 2023-09-06 ENCOUNTER — Ambulatory Visit (INDEPENDENT_AMBULATORY_CARE_PROVIDER_SITE_OTHER): Payer: Medicaid Other | Admitting: Otolaryngology

## 2023-09-11 ENCOUNTER — Ambulatory Visit: Payer: Medicaid Other | Admitting: Internal Medicine

## 2023-09-11 NOTE — Progress Notes (Deleted)
 Office Visit Note  Patient: Barbara Zimmerman             Date of Birth: [birth year]           MRN: [id number]             PCP: [facility] Referring: [facility] Visit Date: 09/11/2023   Subjective:  No chief complaint on file.   History of Present Illness: Barbara Zimmerman is a 33 y.o. female here for follow up ***   Previous HPI 07/23/23 Barbara Zimmerman is a 33 year old female who presents with hand pain and numbness. She was referred by an ENT specialist for evaluation of hand symptoms new in the context of sore throat and lymphadenopathy lasting a few months. She has a reported history of JRA but does not recall details of this.   She has been experiencing significant hand pain and numbness for approximately one month. The symptoms began suddenly in January, with her hands feeling as if they were on fire and locked, particularly affecting her dominant hand. She is unable to close a fist, and the pain radiates from her hand to the middle of her forearm. The symptoms include tingling and burning sensations, occurring in the morning, at night, and intermittently throughout the day.   The onset of her hand symptoms coincided with swollen lymph nodes, cold chills, body aches, and fatigue, although she did not have a fever. While the lymph nodes have improved following antibiotic treatment, the hand symptoms persist. No previous similar issues with her hands or feet. She reports visible swelling, redness, and discoloration in her hands, with dryness and a rash-like appearance on one hand. The symptoms affect all fingers, with varying intensity. No dropping objects due to numbness or locking, as she avoids using her hands when they are locked. The symptoms occasionally wake her at night.   She recalls a past incident where she injured her right 4th finger while lifting a couch, which resulted in swelling and required a ring to be cut off. There is some  persistent swelling at the joint. But she did not recall any injury involving other fingers and the let hand.    She has been using a wrist brace and has tried ibuprofen and Aleve as needed for pain relief, but she is cautious with these medications due to her history of GERD and gastritis. She has not experienced any pain in the shoulder or other joints.   Her medical history includes GERD and gastritis, which limits her use of oral anti-inflammatory medications. She also mentions a past episode of Bell's palsy.   Her mother has a history of carpal tunnel syndrome.   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    Labs reviewed 06/2023 ANA neg ESR 40 CRP 1.0 HBV/HCV immune/negative   No Rheumatology ROS completed.   PMFS History:  Patient Active Problem List   Diagnosis Date Noted   Bilateral hand pain 07/23/2023   History of juvenile arthritis 07/23/2023   Carpal tunnel syndrome on both sides 07/23/2023    Past Medical History:  Diagnosis Date   Asthma    childhood   Bell palsy    Juvenile rheumatoid arthritis (HCC)    Post partum depression    Tonsillitis     Family History  Problem Relation Age of Onset   Diabetes Mother    Past Surgical History:  Procedure Laterality  Date   ABDOMINAL SURGERY     CESAREAN SECTION     Social History   Social History Narrative   Not on file    There is no immunization history on file for this patient.   Objective: Vital Signs: There were no vitals taken for this visit.   Physical Exam   Musculoskeletal Exam: ***  CDAI Exam: CDAI Score: -- Patient Global: --; Provider Global: -- Swollen: --; Tender: -- Joint Exam 09/11/2023   No joint exam has been documented for this visit   There is currently no information documented on the homunculus. Go to the Rheumatology activity and complete the homunculus joint exam.  Investigation: No additional findings.  Imaging: No results  found.  Recent Labs: Lab Results  Component Value Date   WBC 4.2 07/16/2023   HGB 12.8 07/16/2023   PLT 452 (H) 07/16/2023   NA 137 07/16/2023   K 4.1 07/16/2023   CL 105 07/16/2023   CO2 28 07/16/2023   GLUCOSE 87 07/16/2023   BUN 12 07/16/2023   CREATININE 0.71 07/16/2023   BILITOT 0.4 07/16/2023   ALKPHOS 60 07/16/2023   AST 13 (L) 07/16/2023   ALT 14 07/16/2023   PROT 7.5 07/16/2023   ALBUMIN 3.9 07/16/2023   CALCIUM 9.3 07/16/2023   GFRAA >60 12/11/2017    Speciality Comments: No specialty comments available.  Procedures:  No procedures performed Allergies: Penicillins, Shellfish allergy, and Metronidazole   Assessment / Plan:     Visit Diagnoses: No diagnosis found.  ***  Orders: No orders of the defined types were placed in this encounter.  No orders of the defined types were placed in this encounter.    Follow-Up Instructions: No follow-ups on file.   Matt Song, MD  Note - This record has been created using [employer].  Chart creation errors have been sought, but may not always  have been located. Such creation errors do not reflect on  the standard of medical care.

## 2023-09-13 ENCOUNTER — Ambulatory Visit (INDEPENDENT_AMBULATORY_CARE_PROVIDER_SITE_OTHER): Admitting: Otolaryngology

## 2023-09-24 ENCOUNTER — Telehealth (INDEPENDENT_AMBULATORY_CARE_PROVIDER_SITE_OTHER): Payer: Self-pay | Admitting: Otolaryngology

## 2023-09-24 ENCOUNTER — Encounter (INDEPENDENT_AMBULATORY_CARE_PROVIDER_SITE_OTHER): Payer: Self-pay

## 2023-09-24 NOTE — Telephone Encounter (Signed)
 Called patient per provider's request and LVM letting patient know the appt on 09/27/2023 with Dr. Soldatova has been cancelled.  Patient needs to schedule appointments with Oncology and Rheumatology first before following up with Dr. Soldatova. Also sent patient a MyChart msg.

## 2023-09-27 ENCOUNTER — Ambulatory Visit (INDEPENDENT_AMBULATORY_CARE_PROVIDER_SITE_OTHER): Admitting: Otolaryngology

## 2023-10-05 ENCOUNTER — Other Ambulatory Visit (INDEPENDENT_AMBULATORY_CARE_PROVIDER_SITE_OTHER): Payer: Self-pay | Admitting: Otolaryngology

## 2023-10-28 DIAGNOSIS — B009 Herpesviral infection, unspecified: Secondary | ICD-10-CM | POA: Insufficient documentation

## 2023-11-01 ENCOUNTER — Encounter: Payer: Self-pay | Admitting: Medical Oncology

## 2023-11-01 NOTE — Progress Notes (Signed)
 [facility]  [facility] with patient regarding unscheduled CT scan. Patient was to follow up with rescheduling the scan and has not. Asked patient to return call to inform me of her plans. Patient thanked, call back number provided.   Esperanza Hedges, RN, BSN, [facility] Oncology Nurse Navigator, [facility] 11/01/2023 11:55 AM

## 2023-11-13 ENCOUNTER — Encounter: Payer: Self-pay | Admitting: Medical Oncology

## 2023-11-13 NOTE — Progress Notes (Signed)
 [facility]  [facility] with patient regarding work-up with [facility] and the unscheduled CT scan.  Asked patient to return call to inform me of her plans. Patient thanked, call back number provided.   Esperanza Hedges, RN, BSN, [facility] Oncology Nurse Navigator, [facility] 11/13/2023 3:55 PM

## 2023-11-15 ENCOUNTER — Encounter: Payer: Self-pay | Admitting: Medical Oncology

## 2023-11-15 NOTE — Progress Notes (Signed)
 [facility]  LVM with patient's mother regarding inability to get a hold of patient, Mrs. Pariseau. I've attempted to contact patient three times regarding scheduling of CT CAP and to complete work up with [facility] regarding 07/10/23 referral for cervical lymphadenopathy. LM requesting a call back for update on patient's plan.  Mrs. Harles Lied thanked. My call back number provided.   Esperanza Hedges, RN, BSN, [facility] Oncology Nurse Navigator, [facility] 11/15/2023 11:49 AM

## 2023-12-05 ENCOUNTER — Encounter: Payer: Self-pay | Admitting: Medical Oncology

## 2023-12-05 ENCOUNTER — Encounter: Payer: Medicaid Other | Admitting: Internal Medicine

## 2023-12-05 NOTE — Progress Notes (Signed)
 [facility]  Work-up Status:  Incomplete  Situation: Several attempts were made to contact patient regarding incomplete scheduling of CT imaging and recommended completion of workup with the [facility].   Background:  Five attempts were made and voice messages left with patient, with one vm to patient's emergency contact, requesting a call back informing us  of how patient wished to proceed, regarding referral from Dr. Soldatova for Lymphadenopathy of head and neck. Patient had initial visit with the [facility] on 07/16/2023, labs collected and CT imaging scheduled.   Assessment:  This office has been unsuccessful in reaching patient to complete imaging and workup.   Response:  Referring Dr. Ferdinand informed of current status.   Golden Forestine BROCKS, RN  Oncology Nurse Navigator, [facility] 12/05/23 11:12 AM

## 2023-12-11 ENCOUNTER — Ambulatory Visit
Admission: EM | Admit: 2023-12-11 | Discharge: 2023-12-11 | Disposition: A | Discharge status: Home / Self Care | Attending: Family Medicine | Admitting: Family Medicine

## 2023-12-11 ENCOUNTER — Encounter: Payer: Self-pay | Admitting: Oncology

## 2023-12-11 DIAGNOSIS — L049 Acute lymphadenitis, unspecified: Secondary | ICD-10-CM

## 2023-12-11 LAB — POCT RAPID STREP A (OFFICE): Rapid Strep A Screen: NEGATIVE

## 2023-12-11 MED ORDER — AZITHROMYCIN 250 MG PO TABS: 250.0000 mg | ORAL_TABLET | Freq: Every day | ORAL | 0 refills | Status: DC

## 2023-12-11 NOTE — ED Triage Notes (Signed)
 Pt states she started having swelling and pain on the left side of her throat and neck, states it feels like a sharp/electric shock on her tongue x 1 day.

## 2023-12-12 NOTE — ED Provider Notes (Signed)
 [facility]   [id number] 12/11/23 Arrival Time: 1846  ASSESSMENT & PLAN:  1. Lymphadenitis, acute    No signs of peritonsillar abscess. Begin: Meds ordered this encounter  Medications   azithromycin  (ZITHROMAX ) 250 MG tablet    Sig: Take 1 tablet (250 mg total) by mouth daily. Take first 2 tablets together, then 1 every day until finished.    Dispense:  6 tablet    Refill:  0    Results for orders placed or performed during the hospital encounter of 12/11/23  POCT rapid strep A   Collection Time: 12/11/23  7:34 PM  Result Value Ref Range   Rapid Strep A Screen Negative Negative   Labs Reviewed  POCT RAPID STREP A (OFFICE) - Normal    May f/u here if not improving.  Reviewed expectations re: course of current medical issues. Questions answered. Outlined signs and symptoms indicating need for more acute intervention. Patient verbalized understanding. After Visit Summary given.   SUBJECTIVE:  Barbara Zimmerman is a 33 y.o. female who reports a sore throat. Pt states she started having swelling and pain on the left side of her throat and neck, states it feels like a sharp/electric shock on her tongue x 1 day.  Denies fever.  OBJECTIVE:  Vitals:   12/11/23 1918 12/11/23 1919  BP:  121/77  Pulse: 77   Resp: 16   Temp: 97.8 F (36.6 C)   TempSrc: Oral   SpO2: 96%     General appearance: alert; no distress HEENT: throat with mild erythema; uvula is midline Neck: supple with FROM; L sided enlarged and TTP cervical lymph node measuring approx 1.5 x 1 cm Lungs: speaks full sentences without difficulty; unlabored Abd: soft; non-tender Skin: reveals no rash; warm and dry Psychological: alert and cooperative; normal mood and affect  Allergies  Allergen Reactions   Penicillins     Other reaction(s): Hives, Itching of the skin or eyes ()   Shellfish Allergy Swelling    clams   Metronidazole  Itching and Rash    Pt reports allergic to pill form only.  She  reports she can use metrogel  w/o any allergic reaction  She reports she can use metrogel  w/o any allergic reaction    Pt reports allergic to pill form only.    Past Medical History:  Diagnosis Date   Asthma    childhood   Bell palsy    Juvenile rheumatoid arthritis (HCC)    Post partum depression    Tonsillitis    Social History   Socioeconomic History   Marital status: Single    Spouse name: Not on file   Number of children: Not on file   Years of education: Not on file   Highest education level: Not on file  Occupational History   Not on file  Tobacco Use   Smoking status: Former    Current packs/day: 0.50    Average packs/day: 0.5 packs/day for 5.0 years (2.5 ttl pk-yrs)    Types: Cigarettes    Passive exposure: Past   Smokeless tobacco: Never  Vaping Use   Vaping status: Never Used  Substance and Sexual Activity   Alcohol use: Not Currently   Drug use: No   Sexual activity: Yes    Partners: Male    Birth control/protection: Pill, None  Other Topics Concern   Not on file  Social History Narrative   Not on file   Social Drivers of Health   Financial Resource Strain: Not at  Risk (08/20/2022)   Received from [employer] Resource Strain: 1  Food Insecurity: Not at Risk (08/20/2022)   Received from [employer]    Food: 1  Transportation Needs: Not at Risk (08/20/2022)   Received from [employer] Needs    Transportation: 1  Physical Activity: Not on File (09/14/2021)   Received from [facility]   Physical Activity    Physical Activity: 0  Stress: Not on File (09/14/2021)   Received from [facility]   Stress    Stress: 0  Social Connections: Not on File (02/03/2023)   Received from [facility]   Social Connections    Connectedness: 0  Intimate Partner Violence: Not on file   Family History  Problem Relation Age of Onset   Diabetes Mother            Zea, Kostka, MD 12/12/23 [phone]

## 2023-12-13 ENCOUNTER — Other Ambulatory Visit: Payer: Self-pay | Admitting: Medical Oncology

## 2023-12-13 ENCOUNTER — Encounter: Payer: Self-pay | Admitting: Medical Oncology

## 2023-12-13 DIAGNOSIS — R591 Generalized enlarged lymph nodes: Secondary | ICD-10-CM

## 2023-12-13 NOTE — Progress Notes (Signed)
 [facility]  Received call from patient informing me that neck lymph node is swollen again, she thinks it started this past Sunday or Monday. 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 I informed patient that this would be difficult to tell and recommended that she complete the CT Chest Abdomen and Pelvis that was ordered in February, in which she cancelled and this office had difficulty with getting a hold of patient for reschedule after several attempts. Patient gave verbal understanding of importance to complete CT imaging. Patient scheduled CT for Monday, December 16, 2023.   Patient encouraged to call me with questions/concerns.  Colene KYM Raider, RN, BSN, [facility] Oncology Nurse Navigator, [facility] 12/13/2023 12:58 PM

## 2023-12-16 ENCOUNTER — Ambulatory Visit (HOSPITAL_COMMUNITY)
Admission: RE | Admit: 2023-12-16 | Discharge: 2023-12-16 | Disposition: A | Discharge status: Home / Self Care | Source: Ambulatory Visit | Attending: Hematology and Oncology | Admitting: Hematology and Oncology

## 2023-12-16 ENCOUNTER — Telehealth: Payer: Self-pay | Admitting: Physician Assistant

## 2023-12-16 ENCOUNTER — Telehealth: Payer: Self-pay | Admitting: Medical Oncology

## 2023-12-16 DIAGNOSIS — R591 Generalized enlarged lymph nodes: Secondary | ICD-10-CM

## 2023-12-16 MED ORDER — IOHEXOL 300 MG/ML  SOLN
100.0000 mL | Freq: Once | INTRAMUSCULAR | Status: AC | PRN
  Administered 2023-12-16: 100 mL via INTRAVENOUS

## 2023-12-16 NOTE — Telephone Encounter (Signed)
 I notified Barbara Zimmerman by phone regarding CT chest, abdomen pelvis results. Image does not show any enlarged lymph nodes in chest, abdomen, or pelvis. Per discussion with Dr. Federico, will order US  soft tissue head and neck to evaluate cervical lymphadenopathy. If lymph nodes are enlarged will refer to ENT for biopsy. Patient reports since taking the z-pak that was prescribed at [facility] visit on 12/11/23 the swelling on the left side of her neck has improved. All of patient's questions were answered and she expressed understanding of the plan provided.

## 2023-12-22 ENCOUNTER — Ambulatory Visit
Admission: EM | Admit: 2023-12-22 | Discharge: 2023-12-22 | Disposition: A | Discharge status: Home / Self Care | Attending: Emergency Medicine | Admitting: Emergency Medicine

## 2023-12-22 ENCOUNTER — Encounter: Payer: Self-pay | Admitting: Emergency Medicine

## 2023-12-22 DIAGNOSIS — H00025 Hordeolum internum left lower eyelid: Secondary | ICD-10-CM | POA: Diagnosis not present

## 2023-12-22 MED ORDER — ERYTHROMYCIN 5 MG/GM OP OINT: TOPICAL_OINTMENT | OPHTHALMIC | 0 refills | Status: DC

## 2023-12-22 NOTE — ED Triage Notes (Signed)
 Pt presents c/o eye swelling x 2 days. Pt says she tried hot compress with no improvement in sxs.

## 2023-12-22 NOTE — Discharge Instructions (Signed)
 Antibiotic ointment twice daily for 5 days in a row Pull down the lower eyelid to put the ointment into  Warm compresses for 20 minutes at a time, 4-5 times daily

## 2023-12-22 NOTE — ED Provider Notes (Signed)
 [name]    CSN: [id number] Arrival date & time: 12/22/23  1345      History   Chief Complaint Chief Complaint  Patient presents with   Eye Problem    HPI Barbara Zimmerman is a 33 y.o. female.  2 day history of left lower eyelid swelling Painful to touch She denies any injury or trauma to the area The eye itself is not red or bothersome, no drainage No vision changes   Tried a few warm compresses and took an ibuprofen    Past Medical History:  Diagnosis Date   Asthma    childhood   Bell palsy    Juvenile rheumatoid arthritis (HCC)    Post partum depression    Tonsillitis     Patient Active Problem List   Diagnosis Date Noted   Bilateral hand pain 07/23/2023   History of juvenile arthritis 07/23/2023   Carpal tunnel syndrome on both sides 07/23/2023    Past Surgical History:  Procedure Laterality Date   ABDOMINAL SURGERY     CESAREAN SECTION      OB History     Gravida  3   Para  1   Term      Preterm  1   AB  2   Living  2      SAB  1   IAB  1   Ectopic      Multiple  1   Live Births  2            Home Medications    Prior to Admission medications   Medication Sig Start Date End Date Taking? Authorizing Provider  erythromycin  ophthalmic ointment Place a 1/2 inch ribbon of ointment into the lower eyelid, twice daily for 5 days 12/22/23  Yes Davie Claud, Asberry, PA-C  azithromycin  (ZITHROMAX ) 250 MG tablet Take 1 tablet (250 mg total) by mouth daily. Take first 2 tablets together, then 1 every day until finished. 12/11/23   Rolinda Rogue, MD  ibuprofen  (ADVIL ) 800 MG tablet Take 1 tablet (800 mg total) by mouth 3 (three) times daily. 07/11/23   Minnie Tinnie BRAVO, PA  NON [facility]    [provider]  NON FORMULARY Natural Vitamins    [provider]  omeprazole  (PRILOSEC) 20 MG capsule TAKE 1 CAPSULE BY MOUTH EVERY DAY 10/07/23   Soldatova, Liuba, MD  TRI-ESTARYLLA  0.18/0.215/0.25 MG-35 MCG tablet  Take 1 tablet by mouth daily. 06/28/21   [provider]    Family History Family History  Problem Relation Age of Onset   Diabetes Mother     Social History Social History   Tobacco Use   Smoking status: Former    Current packs/day: 0.50    Average packs/day: 0.5 packs/day for 5.0 years (2.5 ttl pk-yrs)    Types: Cigarettes    Passive exposure: Past   Smokeless tobacco: Never  Vaping Use   Vaping status: Never Used  Substance Use Topics   Alcohol use: Not Currently   Drug use: No     Allergies   Penicillins, Shellfish allergy, and Metronidazole    Review of Systems Review of Systems  As per HPI  Physical Exam Triage Vital Signs ED Triage Vitals  Encounter Vitals Group     BP 12/22/23 1429 123/84     Girls Systolic BP Percentile --      Girls Diastolic BP Percentile --      Boys Systolic BP Percentile --      Boys  Diastolic BP Percentile --      Pulse Rate 12/22/23 1429 76     Resp 12/22/23 1429 18     Temp 12/22/23 1429 98.1 F (36.7 C)     Temp Source 12/22/23 1429 Oral     SpO2 12/22/23 1429 98 %     Weight 12/22/23 1428 242 lb 1 oz (109.8 kg)     Height --      Head Circumference --      Peak Flow --      Pain Score 12/22/23 1425 10     Pain Loc --      Pain Education --      Exclude from Growth Chart --    No data found.  Updated Vital Signs BP 123/84 (BP Location: Left Arm)   Pulse 76   Temp 98.1 F (36.7 C) (Oral)   Resp 18   Wt 242 lb 1 oz (109.8 kg)   LMP 12/09/2023 (Exact Date)   SpO2 98%   BMI 37.91 kg/m   Physical Exam Vitals and nursing note reviewed.  Constitutional:      General: She is not in acute distress.    Appearance: Normal appearance.  HENT:     Mouth/Throat:     Pharynx: Oropharynx is clear.  Eyes:     General: Lids are everted, no foreign bodies appreciated. Vision grossly intact. Gaze aligned appropriately.        Left eye: Hordeolum present.No foreign body.     Extraocular Movements: Extraocular  movements intact.     Left eye: Normal extraocular motion and no nystagmus.     Conjunctiva/sclera: Conjunctivae normal.     Left eye: Left conjunctiva is not injected.     Pupils: Pupils are equal, round, and reactive to light.      Comments: Left lower eyelid swelling medially. Tender to touch. No swelling upper eyelid. No conjunctival injection, no drainage. EOM intact without pain  Cardiovascular:     Rate and Rhythm: Normal rate and regular rhythm.     Heart sounds: Normal heart sounds.  Pulmonary:     Effort: Pulmonary effort is normal.     Breath sounds: Normal breath sounds.  Neurological:     Mental Status: She is alert and oriented to person, place, and time.     UC Treatments / Results  Labs (all labs ordered are listed, but only abnormal results are displayed) Labs Reviewed - No data to display  EKG  Radiology No results found.  Procedures Procedures   Medications Ordered in UC Medications - No data to display  Initial Impression / Assessment and Plan / UC Course  I have reviewed the triage vital signs and the nursing notes.  Pertinent labs & imaging results that were available during my care of the patient were reviewed by me and considered in my medical decision making (see chart for details).  Hordeolum internum, lower left lid No signs of periorbital cellulitis.  Antibiotic ointment twice daily for 5 days, warm compresses several times daily. Advised reasons to return All questions answered   Final Clinical Impressions(s) / UC Diagnoses   Final diagnoses:  Hordeolum internum left lower eyelid     Discharge Instructions      Antibiotic ointment twice daily for 5 days in a row Pull down the lower eyelid to put the ointment into  Warm compresses for 20 minutes at a time, 4-5 times daily     ED Prescriptions     Medication  Sig Dispense Auth. Provider   erythromycin  ophthalmic ointment Place a 1/2 inch ribbon of ointment into the lower  eyelid, twice daily for 5 days 3.5 g Lawyer Washabaugh, Asberry, PA-C      PDMP not reviewed this encounter.   Jeryl Asberry, PA-C 12/22/23 1519

## 2023-12-23 ENCOUNTER — Emergency Department (HOSPITAL_BASED_OUTPATIENT_CLINIC_OR_DEPARTMENT_OTHER)
Admission: EM | Admit: 2023-12-23 | Discharge: 2023-12-23 | Disposition: A | Discharge status: Home / Self Care | Attending: Emergency Medicine | Admitting: Emergency Medicine

## 2023-12-23 ENCOUNTER — Encounter (HOSPITAL_BASED_OUTPATIENT_CLINIC_OR_DEPARTMENT_OTHER): Payer: Self-pay | Admitting: *Deleted

## 2023-12-23 ENCOUNTER — Ambulatory Visit (HOSPITAL_COMMUNITY)

## 2023-12-23 ENCOUNTER — Other Ambulatory Visit: Payer: Self-pay

## 2023-12-23 ENCOUNTER — Encounter: Payer: Self-pay | Admitting: Medical Oncology

## 2023-12-23 DIAGNOSIS — H00015 Hordeolum externum left lower eyelid: Secondary | ICD-10-CM | POA: Insufficient documentation

## 2023-12-23 DIAGNOSIS — H5712 Ocular pain, left eye: Secondary | ICD-10-CM | POA: Diagnosis present

## 2023-12-23 MED ORDER — IBUPROFEN 800 MG PO TABS
800.0000 mg | ORAL_TABLET | Freq: Once | ORAL | Status: AC
  Administered 2023-12-23: 800 mg via ORAL
  Filled 2023-12-23: qty 1

## 2023-12-23 MED ORDER — OFLOXACIN 0.3 % OP SOLN
2.0000 [drp] | Freq: Four times a day (QID) | OPHTHALMIC | Status: DC
  Filled 2023-12-23: qty 5

## 2023-12-23 NOTE — ED Provider Notes (Signed)
 [place] EMERGENCY DEPARTMENT AT [facility] Provider Note   CSN: [id number] Arrival date & time: 12/23/23  9491     Patient presents with: Eye Pain   Barbara Zimmerman is a 33 y.o. female.   33 year old female presents ER today with pain from stye.  Patient was diagnosed yesterday start erythromycin  ointment states it makes her eye burn.  She states that she woke up this morning her eye was more swollen and she light and it hurt worse and was hard to open secondary to the swelling.  No fevers, vision changes or other associated symptoms.  She has some blurry vision but she looks a couple times to get the water out of that it is fine.  No foreign body to the eye.  No pressure behind the eye.  She shows me a picture of and it actually appears that the stye is little bit better than it was previously.   Eye Pain       Prior to Admission medications   Medication Sig Start Date End Date Taking? Authorizing Provider  erythromycin  ophthalmic ointment Place a 1/2 inch ribbon of ointment into the lower eyelid, twice daily for 5 days 12/22/23   Rising, Asberry, PA-C  NON [facility]    [provider]  NON FORMULARY Natural Vitamins    [provider]  omeprazole  (PRILOSEC) 20 MG capsule TAKE 1 CAPSULE BY MOUTH EVERY DAY 10/07/23   Soldatova, Liuba, MD  TRI-ESTARYLLA  0.18/0.215/0.25 MG-35 MCG tablet Take 1 tablet by mouth daily. 06/28/21   [provider]    Allergies: Penicillins, Shellfish allergy, and Metronidazole     Review of Systems  Eyes:  Positive for pain.    Updated Vital Signs BP 123/73   Pulse 80   Temp 99 F (37.2 C)   Resp 20   Ht 5' 5.5 (1.664 m)   Wt 99.8 kg   LMP 12/09/2023 (Exact Date)   SpO2 100%   BMI 36.05 kg/m   Physical Exam Vitals and nursing note reviewed.  Constitutional:      Appearance: She is well-developed.  HENT:     Head: Normocephalic and atraumatic.  Eyes:     Extraocular Movements: Extraocular  movements intact.     Conjunctiva/sclera: Conjunctivae normal.     Pupils: Pupils are equal, round, and reactive to light.     Comments: Mild swelling, ttp, induration without fluctuance to left middle lower eyelid without obvious purulence expressed.   Cardiovascular:     Rate and Rhythm: Normal rate and regular rhythm.  Pulmonary:     Effort: No respiratory distress.     Breath sounds: No stridor.  Abdominal:     General: There is no distension.  Musculoskeletal:     Cervical back: Normal range of motion.  Skin:    General: Skin is warm and dry.  Neurological:     Mental Status: She is alert.     (all labs ordered are listed, but only abnormal results are displayed) Labs Reviewed - No data to display  EKG: None  Radiology: No results found.   Procedures   Medications Ordered in the ED  ibuprofen  (ADVIL ) tablet 800 mg (has no administration in time range)  ofloxacin  (OCUFLOX ) 0.3 % ophthalmic solution 2 drop (has no administration in time range)  Medical Decision Making Risk Prescription drug management.   Will switch to ofloxacin  to see if it helps. Ophtho fu suggested, referral placed. Low suspicion for orbital cellulitis or endophthalmitis.      Final diagnoses:  Hordeolum externum of left lower eyelid    ED Discharge Orders          Ordered    Ambulatory referral to Ophthalmology        12/23/23 0541               Jonel Sick, Selinda, MD 12/23/23 [phone]

## 2023-12-23 NOTE — Progress Notes (Signed)
 [facility]  Returned call to patient's vm this morning. Patient informed me that she was in the ED this morning for an eye infection and missed her US  of neck appointment scheduled for 0730. Informed patient that she can call central scheduling to reschedule the appointment, for a time that is convenient for her schedule. Patient gave her verbal understanding and confirms to have central scheduling's number. Patient denies further questions at this time.   Colene KYM Raider, RN, BSN, [facility] Oncology Nurse Navigator, [facility] 12/23/2023 11:49 AM

## 2023-12-23 NOTE — ED Triage Notes (Addendum)
 Pt presents with left lower eye swelling and pain. Was seen at urgent care yesterday and given erythromycin  eye ointment. C/o burning to her eye and a little bit of blurred vision. Sensitive to light. Denies any injury. Eye has been watering as well. Has been using warm compresses.

## 2023-12-25 ENCOUNTER — Ambulatory Visit (HOSPITAL_COMMUNITY)

## 2023-12-25 ENCOUNTER — Encounter: Payer: Self-pay | Admitting: Medical Oncology

## 2023-12-30 ENCOUNTER — Ambulatory Visit (HOSPITAL_COMMUNITY)

## 2024-01-09 ENCOUNTER — Encounter: Payer: Self-pay | Admitting: Medical Oncology

## 2024-01-09 NOTE — Progress Notes (Signed)
 [facility]  Follow up call to patient regarding her US  Soft Tissue Head/Neck has not been scheduled. Patient had missed her scheduled appointment and was planning on rescheduling. Patient states that she has had her hands full with her children and is waiting till they return to school so that she has more availability to complete. I gave her my understanding and encouraged her to call me should she need assistance with scheduling.   Colene KYM Raider, RN, BSN, [facility] Oncology Nurse Navigator, [facility] 01/09/2024 12:07 PM

## 2024-02-09 ENCOUNTER — Ambulatory Visit: Payer: Self-pay

## 2024-02-19 ENCOUNTER — Ambulatory Visit
Admission: EM | Admit: 2024-02-19 | Discharge: 2024-02-19 | Disposition: A | Discharge status: Home / Self Care | Attending: Nurse Practitioner | Admitting: Nurse Practitioner

## 2024-02-19 ENCOUNTER — Encounter: Payer: Self-pay | Admitting: Emergency Medicine

## 2024-02-19 DIAGNOSIS — H61893 Other specified disorders of external ear, bilateral: Secondary | ICD-10-CM | POA: Diagnosis not present

## 2024-02-19 DIAGNOSIS — H6123 Impacted cerumen, bilateral: Secondary | ICD-10-CM

## 2024-02-19 DIAGNOSIS — K219 Gastro-esophageal reflux disease without esophagitis: Secondary | ICD-10-CM | POA: Diagnosis not present

## 2024-02-19 DIAGNOSIS — H9201 Otalgia, right ear: Secondary | ICD-10-CM | POA: Diagnosis not present

## 2024-02-19 DIAGNOSIS — J029 Acute pharyngitis, unspecified: Secondary | ICD-10-CM | POA: Diagnosis not present

## 2024-02-19 LAB — POCT RAPID STREP A (OFFICE): Rapid Strep A Screen: NEGATIVE

## 2024-02-19 MED ORDER — ONDANSETRON 8 MG PO TBDP: 8.0000 mg | ORAL_TABLET | Freq: Three times a day (TID) | ORAL | 0 refills | Status: DC | PRN

## 2024-02-19 MED ORDER — ONDANSETRON 4 MG PO TBDP
4.0000 mg | ORAL_TABLET | Freq: Once | ORAL | Status: AC
  Administered 2024-02-19: 4 mg via ORAL

## 2024-02-19 MED ORDER — FAMOTIDINE 20 MG PO TABS: 20.0000 mg | ORAL_TABLET | Freq: Every evening | ORAL | 0 refills | Status: AC

## 2024-02-19 MED ORDER — LEVOCETIRIZINE DIHYDROCHLORIDE 5 MG PO TABS: 5.0000 mg | ORAL_TABLET | Freq: Every day | ORAL | 0 refills | Status: AC

## 2024-02-19 NOTE — ED Provider Notes (Signed)
 [name]    CSN: [id number] Arrival date & time: 02/19/24  0841      History   Chief Complaint Chief Complaint  Patient presents with   Sore Throat   Otalgia    HPI Barbara Zimmerman is a 33 y.o. female.   Discussed the use of AI scribe software for clinical note transcription with the patient, who gave verbal consent to proceed.   The patient presents with right tonsil swelling and ear pain that has been ongoing for 3 days. The patient reports that both tonsils are swollen, with the right side being more severely painful than the left. The patient denies fevers, chills, headache, runny nose, congestion, sneezing, drainage, or cough. She reports experiencing nausea related to her known GERD and gastritis but denies vomiting or blood in stool. She mentions having chest burning in the past but not today. The patient is currently taking omeprazole  as needed for her GERD and has identified her triggers as dairy, acidic foods, spicy foods, and alcohol. She previously had Zofran  which helped with nausea but has run out. The patient denies regurgitation of food, problems swallowing, hoarseness, or persistent cough.  The following sections of the patient's history were reviewed and updated as appropriate: allergies, current medications, past family history, past medical history, past social history, past surgical history, and problem list.     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 Authorizing Provider  famotidine  (PEPCID ) 20 MG tablet Take 1 tablet (20 mg total) by mouth every evening. 02/19/24  Yes Iola Lukes, FNP  levocetirizine (XYZAL ) 5 MG tablet Take 1 tablet (5 mg total) by mouth at bedtime. 02/19/24  Yes Daziah Hesler, Lukes, FNP  omeprazole  (PRILOSEC) 20 MG capsule TAKE 1 CAPSULE BY MOUTH EVERY DAY 10/07/23  Yes Soldatova, Liuba, MD  ondansetron  (ZOFRAN -ODT) 8 MG disintegrating tablet Take 1 tablet (8 mg total) by mouth every 8 (eight) hours as needed for nausea or vomiting. 02/19/24  Yes Iola Lukes, FNP  semaglutide-weight management (WEGOVY) 0.25 MG/0.5ML SOAJ SQ injection Inject 0.25 mg into the skin once a week. 02/04/24  Yes [provider]  TRI-ESTARYLLA  0.18/0.215/0.25 MG-35 MCG tablet Take 1 tablet by mouth daily. 06/28/21  Yes [provider]  erythromycin  ophthalmic ointment Place a 1/2 inch ribbon of ointment into the lower eyelid, twice daily for 5 days Patient not taking: Reported on 02/19/2024 12/22/23   Rising, Asberry, PA-C  NON FORMULARY [place] Patient not taking: Reported on 02/19/2024    [provider]  NON FORMULARY Natural Vitamins Patient not taking: Reported on 02/19/2024    [provider]    Family History Family History  Problem Relation Age of Onset   Diabetes Mother     Social History Social History   Tobacco Use   Smoking status: Former    Current packs/day: 0.50    Average packs/day: 0.5 packs/day for 5.0 years (2.5 ttl pk-yrs)    Types: Cigarettes    Passive exposure: Past   Smokeless tobacco: Never  Vaping Use   Vaping status: Never Used  Substance Use Topics   Alcohol use: Not Currently   Drug use: No     Allergies   Penicillins, Shellfish allergy, and  Metronidazole    Review of Systems Review of Systems  Constitutional:  Negative for chills and fever.  HENT:  Positive for ear pain (bilaterally) and sore throat (R>L). Negative for congestion, rhinorrhea and sneezing.   Respiratory:  Negative for cough.   Gastrointestinal:  Positive for nausea. Negative for diarrhea and vomiting.  Musculoskeletal:  Negative for myalgias.  Neurological:  Negative for headaches.  All other systems reviewed and are negative.    Physical Exam Triage Vital Signs ED Triage Vitals [02/19/24 0942]  Encounter Vitals Group     BP 117/84     Girls Systolic BP Percentile      Girls Diastolic BP Percentile      Boys Systolic BP Percentile      Boys Diastolic BP Percentile      Pulse Rate 68     Resp 16     Temp (!) 97.5 F (36.4 C)     Temp Source Oral     SpO2 97 %     Weight      Height      Head Circumference      Peak Flow      Pain Score 7     Pain Loc      Pain Education      Exclude from Growth Chart    No data found.  Updated Vital Signs BP 117/84 (BP Location: Left Arm)   Pulse 68   Temp (!) 97.5 F (36.4 C) (Oral)   Resp 16   LMP 02/16/2024 (Approximate)   SpO2 97%   Visual Acuity Right Eye Distance:   Left Eye Distance:   Bilateral Distance:    Right Eye Near:   Left Eye Near:    Bilateral Near:     Physical Exam Vitals reviewed.  Constitutional:      General: She is awake. She is not in acute distress.    Appearance: Normal appearance. She is well-developed. She is obese. She is not ill-appearing, toxic-appearing or diaphoretic.  HENT:     Head: Normocephalic.     Right Ear: Hearing, tympanic membrane, ear canal and external ear normal. No decreased hearing noted. Tenderness present. No drainage or swelling. No middle ear effusion. There is no impacted cerumen. Tympanic membrane is not erythematous.     Left Ear: Hearing, tympanic membrane, ear canal and external ear normal. No decreased hearing noted. No drainage,  swelling or tenderness.  No middle ear effusion. There is no impacted cerumen. Tympanic membrane is not erythematous.     Ears:     Comments: Increased cerumen noted bilaterally, L>R, without evidence of significant impaction. Right ear tenderness. No evidence of infection of either ear.  Nose: Nose normal. No congestion or rhinorrhea.     Mouth/Throat:     Lips: Pink.     Mouth: Mucous membranes are moist.     Pharynx: Oropharynx is clear. Uvula midline. No pharyngeal swelling, oropharyngeal exudate, posterior oropharyngeal erythema, uvula swelling or postnasal drip.     Tonsils: No tonsillar exudate or tonsillar abscesses.  Eyes:     General: Vision grossly intact.     Conjunctiva/sclera: Conjunctivae normal.  Neck:     Trachea: Phonation normal.  Cardiovascular:     Rate and Rhythm: Normal rate and regular rhythm.     Heart sounds: Normal heart sounds.  Pulmonary:     Effort: Pulmonary effort is normal.     Breath sounds: Normal breath sounds and air entry.  Musculoskeletal:        General: Normal range of motion.     Cervical back: Full passive range of motion without pain, normal range of motion and neck supple.  Lymphadenopathy:     Cervical: No cervical adenopathy.  Skin:    General: Skin is warm and dry.  Neurological:     General: No focal deficit present.     Mental Status: She is alert and oriented to person, place, and time.  Psychiatric:        Speech: Speech normal.        Behavior: Behavior is cooperative.      UC Treatments / Results  Labs (all labs ordered are listed, but only abnormal results are displayed) Labs Reviewed  POCT RAPID STREP A (OFFICE) - Normal  CULTURE, GROUP A STREP [facility])    EKG   Radiology No results found.  Procedures Ear Cerumen Removal  Date/Time: 02/19/2024 11:25 AM  Performed by: Iola Lukes, FNP Authorized by: Iola Lukes, FNP   Consent:    Consent obtained:  Verbal   Consent given by:  Patient    Risks, benefits, and alternatives were discussed: yes     Risks discussed:  Infection, pain, dizziness, incomplete removal, TM perforation and bleeding   Alternatives discussed:  No treatment Universal protocol:    Patient identity confirmed:  Verbally with patient and arm band Procedure details:    Location:  L ear and R ear   Procedure type: irrigation     Procedure outcomes: cerumen removed   Post-procedure details:    Inspection:  No bleeding and TM intact   Hearing quality:  Normal   Procedure completion:  Tolerated well, no immediate complications  (including critical care time)  Medications Ordered in UC Medications  ondansetron  (ZOFRAN -ODT) disintegrating tablet 4 mg (4 mg Oral Given 02/19/24 0948)    Initial Impression / Assessment and Plan / UC Course  I have reviewed the triage vital signs and the nursing notes.  Pertinent labs & imaging results that were available during my care of the patient were reviewed by me and considered in my medical decision making (see chart for details).     The patient presents with three days of throat discomfort, worse on the right tonsil, along with sharp right ear pain. Exam showed normal tonsils without swelling and significant cerumen buildup, particularly in the left ear. Following irrigation, both ears appeared normal with no signs of infection. Strep test was negative and culture was sent for confirmation. Differential includes post-nasal drip, reflux-related irritation, or mouth-breathing at night. The patient was advised to start a short trial of omeprazole  for 10-14 days, use Xyzal  nightly if post-nasal drainage is suspected, and consider Debrox  for ongoing ear wax management. Zofran  was refilled for GERD-related nausea.  The patient has a known history of GERD and gastritis, managed with as-needed omeprazole , with occasional chest burning triggered by certain foods and alcohol. No recent alarming GI symptoms were reported. Current  management includes medication support and reinforcement of dietary and lifestyle modifications.  Patient was instructed to follow up with primary care for ongoing management of reflux and throat symptoms, and to seek urgent care or ED evaluation if fever, progressive sore throat, difficulty swallowing, persistent vomiting, GI bleeding, or worsening ear pain develops.  Today's evaluation has revealed no signs of a dangerous process. Discussed diagnosis with patient and/or guardian. Patient and/or guardian aware of their diagnosis, possible red flag symptoms to watch out for and need for close follow up. Patient and/or guardian understands verbal and written discharge instructions. Patient and/or guardian comfortable with plan and disposition.  Patient and/or guardian has a clear mental status at this time, good insight into illness (after discussion and teaching) and has clear judgment to make decisions regarding their care  Documentation was completed with the aid of voice recognition software. Transcription may contain typographical errors.  Final Clinical Impressions(s) / UC Diagnoses   Final diagnoses:  Acute sore throat  Otalgia, right ear  Excessive cerumen in ear canal, bilateral  Gastroesophageal reflux disease without esophagitis     Discharge Instructions      You were seen today for throat pain and ear discomfort. Your exam showed no infection in your tonsils or ears. You had extra wax in your ears, which was cleaned, and the exam afterward looked normal. Your strep test today was negative, but we sent a culture to the lab to double-check. Your throat symptoms may be related to acid reflux, drainage from allergies or sinus congestion, or breathing through your mouth at night. To help, start taking omeprazole  once a day for 10-14 days.Famotidine  has been added to take daily in the evenings as well as Xyzal  to take at bedtime. For earwax, you can use over-the-counter Debrox drops at  home to keep your ears clear. Your Zofran  prescription has been refilled in case you need it for nausea related to reflux. For your reflux and stomach irritation, avoid foods and drinks that trigger your symptoms, such as spicy or acidic foods, dairy, or alcohol. Eat smaller meals, avoid lying down right after eating, and try to maintain a healthy weight. Follow up with your primary care provider for ongoing management. Go to the emergency room right away if you develop fever, worsening throat pain, trouble swallowing, vomiting blood, black stools, or sudden worsening ear pain.     ED Prescriptions     Medication Sig Dispense Auth. Provider   ondansetron  (ZOFRAN -ODT) 8 MG disintegrating tablet Take 1 tablet (8 mg total) by mouth every 8 (eight) hours as needed for nausea or vomiting. 12 tablet Iola Lukes, FNP   levocetirizine (XYZAL ) 5 MG tablet Take 1 tablet (5 mg total) by mouth at bedtime. 14 tablet Giordan Fordham, [place], FNP   famotidine  (PEPCID ) 20 MG tablet Take 1 tablet (20 mg total) by mouth every evening. 14 tablet Iola Lukes, FNP      PDMP not reviewed this encounter.   Iola Lukes, [place] 02/19/24 1136

## 2024-02-19 NOTE — ED Triage Notes (Addendum)
 Pt reports sore throat and ear pain x3 days. R side is worse than L. Using mucinex with little relief. Denies nasal congestion, cough, headaches, fevers, or chills.   Pt also reports nausea due to upsetting her GERD this morning with acidic foods. No emesis episodes.   Pt also asks about STD testing at end of triage. Asymptomatic. No known exposures.

## 2024-02-19 NOTE — Discharge Instructions (Signed)
 You were seen today for throat pain and ear discomfort. Your exam showed no infection in your tonsils or ears. You had extra wax in your ears, which was cleaned, and the exam afterward looked normal. Your strep test today was negative, but we sent a culture to the lab to double-check. Your throat symptoms may be related to acid reflux, drainage from allergies or sinus congestion, or breathing through your mouth at night. To help, start taking omeprazole  once a day for 10-14 days.Famotidine  has been added to take daily in the evenings as well as Xyzal  to take at bedtime. For earwax, you can use over-the-counter Debrox drops at home to keep your ears clear. Your Zofran  prescription has been refilled in case you need it for nausea related to reflux. For your reflux and stomach irritation, avoid foods and drinks that trigger your symptoms, such as spicy or acidic foods, dairy, or alcohol. Eat smaller meals, avoid lying down right after eating, and try to maintain a healthy weight. Follow up with your primary care provider for ongoing management. Go to the emergency room right away if you develop fever, worsening throat pain, trouble swallowing, vomiting blood, black stools, or sudden worsening ear pain.

## 2024-02-21 ENCOUNTER — Ambulatory Visit (HOSPITAL_COMMUNITY): Payer: Self-pay

## 2024-02-21 LAB — CULTURE, GROUP A STREP (THRC)

## 2024-02-25 ENCOUNTER — Encounter: Payer: Self-pay | Admitting: Oncology

## 2024-03-13 ENCOUNTER — Ambulatory Visit: Payer: Self-pay

## 2024-03-13 ENCOUNTER — Encounter: Payer: Self-pay | Admitting: Emergency Medicine

## 2024-03-13 ENCOUNTER — Ambulatory Visit: Admission: EM | Admit: 2024-03-13 | Discharge: 2024-03-13 | Disposition: A | Discharge status: Home / Self Care

## 2024-03-13 DIAGNOSIS — Z202 Contact with and (suspected) exposure to infections with a predominantly sexual mode of transmission: Secondary | ICD-10-CM | POA: Diagnosis not present

## 2024-03-13 DIAGNOSIS — N760 Acute vaginitis: Secondary | ICD-10-CM | POA: Diagnosis present

## 2024-03-13 LAB — POCT URINE DIPSTICK
Bilirubin, UA: NEGATIVE
Glucose, UA: NEGATIVE mg/dL
Ketones, POC UA: NEGATIVE mg/dL
Leukocytes, UA: NEGATIVE
Nitrite, UA: NEGATIVE
POC PROTEIN,UA: NEGATIVE
Spec Grav, UA: 1.025 (ref 1.010–1.025)
Urobilinogen, UA: 0.2 U/dL
pH, UA: 6 (ref 5.0–8.0)

## 2024-03-13 MED ORDER — CLINDAMYCIN HCL 300 MG PO CAPS: 300.0000 mg | ORAL_CAPSULE | Freq: Two times a day (BID) | ORAL | 0 refills | Status: AC

## 2024-03-13 MED ORDER — FLUCONAZOLE 150 MG PO TABS: ORAL_TABLET | ORAL | 1 refills | Status: DC

## 2024-03-13 NOTE — ED Provider Notes (Signed)
 [name]    CSN: [id number] Arrival date & time: 03/13/24  1015      History   Chief Complaint Chief Complaint  Patient presents with   Exposure to STD    HPI Barbara Zimmerman is a 33 y.o. female.   Patient presents today to malodorous vaginal discharge for the last 3 days.  Patient states she is in a new relationship and recently had sexual activity on 10/13.  Patient states that she usually has vaginal irritation with use of condoms.  Patient states that she attempted to use metronidazole  cream gel and Diflucan  with no significant relief.  Patient is concerned concerned about STDs as well.  Patient denies changes in urinary habits, dysuria, or back pain.   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 Authorizing Provider  clindamycin  (CLEOCIN ) 300 MG capsule Take 1 capsule (300 mg total) by mouth in the morning and at bedtime for 7 days. 03/13/24 03/20/24 Yes Andra Krabbe C, PA-C  famotidine  (PEPCID ) 20 MG tablet Take 1 tablet (20 mg total) by mouth every evening. 02/19/24  Yes Iola Lukes, FNP  fluconazole  (DIFLUCAN ) 150 MG tablet Take 1 tab po one day 1 then take one po 3 days later 03/13/24  Yes Andra Krabbe C, PA-C   levocetirizine (XYZAL ) 5 MG tablet Take 1 tablet (5 mg total) by mouth at bedtime. 02/19/24  Yes Iola Lukes, FNP  metFORMIN (GLUCOPHAGE) 500 MG tablet Take 500 mg by mouth daily with breakfast. 03/03/24  Yes [provider]  omeprazole  (PRILOSEC) 20 MG capsule TAKE 1 CAPSULE BY MOUTH EVERY DAY 10/07/23  Yes Soldatova, Liuba, MD  ondansetron  (ZOFRAN -ODT) 8 MG disintegrating tablet Take 1 tablet (8 mg total) by mouth every 8 (eight) hours as needed for nausea or vomiting. 02/19/24  Yes Iola Lukes, FNP  TRI-ESTARYLLA  0.18/0.215/0.25 MG-35 MCG tablet Take 1 tablet by mouth daily. 06/28/21  Yes [provider]  erythromycin  ophthalmic ointment Place a 1/2 inch ribbon of ointment into the lower eyelid, twice daily for 5 days Patient not taking: Reported on 02/19/2024 12/22/23   Rising, Asberry, PA-C  NON [facility] Patient not taking: Reported on 02/19/2024    [provider]  NON FORMULARY Natural Vitamins Patient not taking: Reported on 02/19/2024    [provider]  ondansetron  (ZOFRAN ) 4 MG tablet Take 4 mg by mouth. Patient not  taking: Reported on 03/13/2024 03/03/24   [provider]  semaglutide-weight management (WEGOVY) 0.25 MG/0.5ML SOAJ SQ injection Inject 0.25 mg into the skin once a week. Patient not taking: Reported on 03/13/2024 02/04/24   [provider]    Family History Family History  Problem Relation Age of Onset   Diabetes Mother     Social History Social History   Tobacco Use   Smoking status: Former    Current packs/day: 0.50    Average packs/day: 0.5 packs/day for 5.0 years (2.5 ttl pk-yrs)    Types: Cigarettes    Passive exposure: Past   Smokeless tobacco: Never  Vaping Use   Vaping status: Never Used  Substance Use Topics   Alcohol use: Not Currently   Drug use: No     Allergies   Penicillins, Shellfish allergy, and Metronidazole    Review of Systems Review of Systems   Physical  Exam Triage Vital Signs ED Triage Vitals  Encounter Vitals Group     BP 03/13/24 1244 123/86     Girls Systolic BP Percentile --      Girls Diastolic BP Percentile --      Boys Systolic BP Percentile --      Boys Diastolic BP Percentile --      Pulse Rate 03/13/24 1244 76     Resp 03/13/24 1244 16     Temp 03/13/24 1244 98.2 F (36.8 C)     Temp Source 03/13/24 1244 Oral     SpO2 03/13/24 1244 97 %     Weight --      Height --      Head Circumference --      Peak Flow --      Pain Score 03/13/24 1245 0     Pain Loc --      Pain Education --      Exclude from Growth Chart --    No data found.  Updated Vital Signs BP 123/86 (BP Location: Left Arm)   Pulse 76   Temp 98.2 F (36.8 C) (Oral)   Resp 16   LMP 02/16/2024 (Approximate)   SpO2 97%   Visual Acuity Right Eye Distance:   Left Eye Distance:   Bilateral Distance:    Right Eye Near:   Left Eye Near:    Bilateral Near:     Physical Exam Vitals and nursing note reviewed.  Constitutional:      General: She is not in acute distress.    Appearance: Normal appearance. She is not ill-appearing, toxic-appearing or diaphoretic.  Eyes:     General: No scleral icterus. Cardiovascular:     Rate and Rhythm: Normal rate and regular rhythm.     Heart sounds: Normal heart sounds.  Pulmonary:     Effort: Pulmonary effort is normal. No respiratory distress.     Breath sounds: Normal breath sounds. No wheezing or rhonchi.  Abdominal:     General: Abdomen is flat. Bowel sounds are normal.     Palpations: Abdomen is soft.     Tenderness: There is abdominal tenderness in the suprapubic area. There is no right CVA tenderness or left CVA tenderness.  Skin:    General: Skin is warm.  Neurological:     Mental Status: She is alert and oriented to person, place, and time.  Psychiatric:        Mood and Affect: Mood normal.        Behavior: Behavior normal.      UC Treatments /  Results  Labs (all labs ordered are listed,  but only abnormal results are displayed) Labs Reviewed  CERVICOVAGINAL ANCILLARY ONLY    EKG   Radiology No results found.  Procedures Procedures (including critical care time)  Medications Ordered in UC Medications - No data to display  Initial Impression / Assessment and Plan / UC Course  I have reviewed the triage vital signs and the nursing notes.  Pertinent labs & imaging results that were available during my care of the patient were reviewed by me and considered in my medical decision making (see chart for details).     Acute vaginitis.-Will be tested for gonorrhea, chlamydia, trichomoniasis, BV, and yeast.  Patient was prescribed Diflucan  150 mg 1 tab p.o. every 3 days, and clindamycin  300 mg twice daily for 7 days for BV. Final Clinical Impressions(s) / UC Diagnoses   Final diagnoses:  Acute vaginitis     Discharge Instructions      You have been diagnosed with a vaginal infection today. -Be sure to take the medications prescribed as directed and in their entirety. -Use of boric acid vaginal suppositories may assist in vaginal pH maintenance and prevention of yeast and bacterial vaginosis infections, you can start by using twice a week once your symptoms have resolved.  -Be sure to wear clean, cotton underwear and change out of tight, sweaty clothes as soon as possible.     ED Prescriptions     Medication Sig Dispense Auth. Provider   fluconazole  (DIFLUCAN ) 150 MG tablet Take 1 tab po one day 1 then take one po 3 days later 2 tablet Andra Corean BROCKS, PA-C   clindamycin  (CLEOCIN ) 300 MG capsule Take 1 capsule (300 mg total) by mouth in the morning and at bedtime for 7 days. 14 capsule Andra Corean BROCKS, PA-C      PDMP not reviewed this encounter.   Andra Corean BROCKS, PA-C 03/13/24 1324

## 2024-03-13 NOTE — Discharge Instructions (Signed)
 You have been diagnosed with a vaginal infection today. -Be sure to take the medications prescribed as directed and in their entirety. -Use of boric acid vaginal suppositories may assist in vaginal pH maintenance and prevention of yeast and bacterial vaginosis infections, you can start by using twice a week once your symptoms have resolved.  -Be sure to wear clean, cotton underwear and change out of tight, sweaty clothes as soon as possible.

## 2024-03-13 NOTE — ED Triage Notes (Signed)
 Pt reports fishy odor, vaginal irritation, and vaginal itching x3 days. Pt reports last intercourse was 10/13. States condoms usually cause irritation to her skin and one was used. Would like to be tested for STDs, yeast, and BV. No known exposures. Pt reports having leftover medication at home. Took a fluconazole  pill and applied metronidazole  gel with no change in symptoms.

## 2024-03-16 LAB — CERVICOVAGINAL ANCILLARY ONLY
Bacterial Vaginitis (gardnerella): NEGATIVE
Candida Glabrata: NEGATIVE
Candida Vaginitis: NEGATIVE
Chlamydia: NEGATIVE
Comment: NEGATIVE
Comment: NEGATIVE
Comment: NEGATIVE
Comment: NEGATIVE
Comment: NEGATIVE
Comment: NORMAL
Neisseria Gonorrhea: NEGATIVE
Trichomonas: NEGATIVE

## 2024-04-07 ENCOUNTER — Encounter: Payer: Self-pay | Admitting: Emergency Medicine

## 2024-04-07 ENCOUNTER — Ambulatory Visit
Admission: EM | Admit: 2024-04-07 | Discharge: 2024-04-07 | Disposition: A | Discharge status: Home / Self Care | Attending: Student | Admitting: Student

## 2024-04-07 DIAGNOSIS — S00262A Insect bite (nonvenomous) of left eyelid and periocular area, initial encounter: Secondary | ICD-10-CM | POA: Diagnosis not present

## 2024-04-07 DIAGNOSIS — W57XXXA Bitten or stung by nonvenomous insect and other nonvenomous arthropods, initial encounter: Secondary | ICD-10-CM

## 2024-04-07 MED ORDER — HYDROCORTISONE 1 % EX CREA: TOPICAL_CREAM | CUTANEOUS | 0 refills | Status: AC

## 2024-04-07 MED ORDER — PREDNISONE 20 MG PO TABS: 40.0000 mg | ORAL_TABLET | Freq: Every day | ORAL | 0 refills | Status: AC

## 2024-04-07 NOTE — Discharge Instructions (Addendum)
-  Prednisone , 2 pills taken at the same time today -Hydrocortisone cream applied 1-2x daily -Try taking 1/2-1 tablet of Zyrtec  at bedtime tonight

## 2024-04-07 NOTE — ED Provider Notes (Signed)
 [name]    CSN: [id number] Arrival date & time: 04/07/24  1210      History   Chief Complaint Chief Complaint  Patient presents with   Insect Bite    HPI Barbara Zimmerman is a 33 y.o. female presenting with insect bite - face, since today. H/o bell palsy. Pt presents c/o bug bite on face x today. Pt states,  I woke up this morning and felt irritation and a burning sensation on my face. The longer I was awake it started swelling even more and it hurts.  Took 1/2 tablet of xyzal , which has caused significant drowsiness.    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 Authorizing Provider  hydrocortisone cream 1 % Apply to affected area 2 times daily 04/07/24  Yes Andromeda Poppen E, PA-C  levocetirizine (XYZAL ) 5 MG tablet Take 1 tablet (5 mg total) by mouth at bedtime. 02/19/24  Yes Murrill, Samantha, FNP  predniSONE  (DELTASONE ) 20 MG tablet Take 2 tablets (40 mg total) by mouth daily for 1 day. Take with breakfast or lunch. Avoid NSAIDs (ibuprofen , etc) while taking this medication. 04/07/24 04/08/24 Yes Anavi Branscum E, PA-C  famotidine  (PEPCID ) 20 MG tablet Take 1 tablet (20 mg total) by mouth every evening. 02/19/24   Iola Lukes, FNP  fluconazole   (DIFLUCAN ) 150 MG tablet Take 1 tab po one day 1 then take one po 3 days later 03/13/24   Andra Corean BROCKS, PA-C  metFORMIN (GLUCOPHAGE) 500 MG tablet Take 500 mg by mouth daily with breakfast. 03/03/24   [provider]  omeprazole  (PRILOSEC) 20 MG capsule TAKE 1 CAPSULE BY MOUTH EVERY DAY 10/07/23   Soldatova, Liuba, MD  ondansetron  (ZOFRAN -ODT) 8 MG disintegrating tablet Take 1 tablet (8 mg total) by mouth every 8 (eight) hours as needed for nausea or vomiting. 02/19/24   Iola Lukes, FNP  TRI-ESTARYLLA  0.18/0.215/0.25 MG-35 MCG tablet Take 1 tablet by mouth daily. 06/28/21   [provider]    Family History Family History  Problem Relation Age of Onset   Diabetes Mother     Social History Social History   Tobacco Use   Smoking status: Former    Current packs/day: 0.50    Average packs/day: 0.5 packs/day for 5.0 years (2.5 ttl pk-yrs)    Types: Cigarettes    Passive exposure: Past   Smokeless tobacco: Never  Vaping Use  Vaping status: Never Used  Substance Use Topics   Alcohol use: Not Currently   Drug use: No     Allergies   Penicillins, Shellfish allergy, and Metronidazole    Review of Systems Review of Systems  Skin:        Insect bite      Physical Exam Triage Vital Signs ED Triage Vitals [04/07/24 1324]  Encounter Vitals Group     BP 106/66     Girls Systolic BP Percentile      Girls Diastolic BP Percentile      Boys Systolic BP Percentile      Boys Diastolic BP Percentile      Pulse Rate 75     Resp 18     Temp 98.3 F (36.8 C)     Temp Source Oral     SpO2 97 %     Weight 220 lb 0.3 oz (99.8 kg)     Height      Head Circumference      Peak Flow      Pain Score      Pain Loc      Pain Education      Exclude from Growth Chart    No data found.  Updated Vital Signs BP 106/66 (BP Location: Left Arm)   Pulse 75   Temp 98.3 F (36.8 C) (Oral)   Resp 18   Wt 220 lb 0.3 oz (99.8 kg)   LMP 03/27/2024 (Exact Date)    SpO2 97%   BMI 36.06 kg/m   Visual Acuity Right Eye Distance:   Left Eye Distance:   Bilateral Distance:    Right Eye Near:   Left Eye Near:    Bilateral Near:     Physical Exam Vitals reviewed.  Constitutional:      General: She is not in acute distress.    Appearance: Normal appearance. She is not ill-appearing.  HENT:     Head: Normocephalic and atraumatic.  Pulmonary:     Effort: Pulmonary effort is normal.  Skin:    Comments: See image below L inferior periocular area with small 5mm papule and minimal surrounding effusion. EOMI intact and without pain. No proptosis.  Neurological:     General: No focal deficit present.     Mental Status: She is alert and oriented to person, place, and time.  Psychiatric:        Mood and Affect: Mood normal.        Behavior: Behavior normal.        Thought Content: Thought content normal.        Judgment: Judgment normal.       UC Treatments / Results  Labs (all labs ordered are listed, but only abnormal results are displayed) Labs Reviewed - No data to display  EKG   Radiology No results found.  Procedures Procedures (including critical care time)  Medications Ordered in UC Medications - No data to display  Initial Impression / Assessment and Plan / UC Course  I have reviewed the triage vital signs and the nursing notes.  Pertinent labs & imaging results that were available during my care of the patient were reviewed by me and considered in my medical decision making (see chart for details).     Patient is a pleasant 33 year old female presenting with insect bite of the left periocular area.  The bite and surrounding effusion is mild, but she has an event in several days, and needs to speed up healing.  Discussed option of prednisone .  She is very sensitive to medication, so we will do a 40 mg one-time dosage.  She will also take Zyrtec  at bedtime tonight.  Trial of low potency hydrocortisone cream.  Final Clinical  Impressions(s) / UC Diagnoses   Final diagnoses:  Insect bite of left periocular area, initial encounter     Discharge Instructions      -Prednisone , 2 pills taken at the same time today -Hydrocortisone cream applied 1-2x daily -Try taking 1/2-1 tablet of Zyrtec  at bedtime tonight      ED Prescriptions     Medication Sig Dispense Auth. Provider   predniSONE  (DELTASONE ) 20 MG tablet Take 2 tablets (40 mg total) by mouth daily for 1 day. Take with breakfast or lunch. Avoid NSAIDs (ibuprofen , etc) while taking this medication. 2 tablet Monai Hindes E, PA-C   hydrocortisone cream 1 % Apply to affected area 2 times daily 15 g Oliwia Berzins E, PA-C      PDMP not reviewed this encounter.   Arlyss Leita BRAVO, PA-C 04/07/24 1336

## 2024-04-07 NOTE — ED Triage Notes (Signed)
 Pt presents c/o bug bite on face x today. Pt states,  I woke up this morning and felt irritation and a burning sensation on my face. The longer I was awake it started swelling even more and it hurts.

## 2024-04-21 ENCOUNTER — Encounter: Payer: Self-pay | Admitting: Emergency Medicine

## 2024-04-21 ENCOUNTER — Other Ambulatory Visit: Payer: Self-pay

## 2024-04-21 ENCOUNTER — Ambulatory Visit: Admission: EM | Admit: 2024-04-21 | Discharge: 2024-04-21 | Disposition: A | Discharge status: Home / Self Care

## 2024-04-21 DIAGNOSIS — R110 Nausea: Secondary | ICD-10-CM | POA: Diagnosis not present

## 2024-04-21 DIAGNOSIS — Z202 Contact with and (suspected) exposure to infections with a predominantly sexual mode of transmission: Secondary | ICD-10-CM | POA: Insufficient documentation

## 2024-04-21 MED ORDER — ONDANSETRON 8 MG PO TBDP: 8.0000 mg | ORAL_TABLET | Freq: Three times a day (TID) | ORAL | 0 refills | Status: AC | PRN

## 2024-04-21 NOTE — ED Triage Notes (Signed)
Pt here for STD screening denies sx

## 2024-04-21 NOTE — ED Provider Notes (Signed)
 [name]    CSN: [id number] Arrival date & time: 04/21/24  1058      History   Chief Complaint Chief Complaint  Patient presents with   Exposure to STD    HPI Barbara Zimmerman is a 33 y.o. female.   Pt presents today for testing for STIs, denies symptoms. Pt states that she would also like a refill on nausea meds. Pt denies vaginal discharge, dysuria, lower abdominal pain, or new or different lower back pain.   The history is provided by the patient.  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 Authorizing Provider  famotidine  (PEPCID ) 20 MG tablet Take 1 tablet (20 mg total) by mouth every evening. 02/19/24   Iola Lukes, FNP  fluconazole  (DIFLUCAN ) 150 MG tablet Take 1 tab po one day 1 then take one po 3 days later 03/13/24   Nachmen Mansel C, PA-C  hydrocortisone  cream 1 % Apply to affected area 2 times daily 04/07/24   Graham, Laura E, PA-C  levocetirizine (XYZAL ) 5 MG tablet Take 1 tablet (5 mg total) by mouth at bedtime. 02/19/24   Murrill, Samantha, FNP  metFORMIN (GLUCOPHAGE) 500 MG tablet Take 500 mg by mouth daily with breakfast. 03/03/24   [provider]  omeprazole  (PRILOSEC) 20 MG capsule TAKE 1 CAPSULE BY  MOUTH EVERY DAY 10/07/23   Soldatova, Liuba, MD  ondansetron  (ZOFRAN -ODT) 8 MG disintegrating tablet Take 1 tablet (8 mg total) by mouth every 8 (eight) hours as needed for nausea or vomiting. 04/21/24   Andra Corean BROCKS, PA-C  TRI-ESTARYLLA  0.18/0.215/0.25 MG-35 MCG tablet Take 1 tablet by mouth daily. 06/28/21   [provider]    Family History Family History  Problem Relation Age of Onset   Diabetes Mother     Social History Social History   Tobacco Use   Smoking status: Former    Current packs/day: 0.50    Average packs/day: 0.5 packs/day for 5.0 years (2.5 ttl pk-yrs)    Types: Cigarettes    Passive exposure: Past   Smokeless tobacco: Never  Vaping Use   Vaping status: Never Used  Substance Use Topics   Alcohol use: Not Currently   Drug use: No     Allergies   Penicillins, Shellfish allergy, and Metronidazole    Review of Systems Review of Systems   Physical Exam Triage Vital Signs ED Triage Vitals  Encounter Vitals Group     BP 04/21/24 1108 118/61     Girls Systolic BP Percentile --      Girls Diastolic BP Percentile --      Boys Systolic BP Percentile --      Boys Diastolic BP Percentile --      Pulse Rate 04/21/24 1108 73     Resp 04/21/24 1108 18     Temp 04/21/24 1108 97.9 F (36.6 C)     Temp Source 04/21/24 1108 Oral     SpO2 04/21/24 1108 97 %     Weight --      Height --      Head Circumference --      Peak Flow --      Pain Score 04/21/24 1109 0     Pain Loc --      Pain Education --      Exclude from Growth Chart --    No data found.  Updated Vital Signs BP 118/61 (BP Location: Left Arm)   Pulse 73   Temp 97.9 F (36.6 C) (Oral)   Resp 18   LMP 03/27/2024 (Exact Date)   SpO2 97%   Visual Acuity Right Eye Distance:   Left Eye Distance:   Bilateral Distance:    Right Eye Near:   Left Eye Near:    Bilateral Near:     Physical Exam Vitals and nursing note reviewed.  Constitutional:      General: She is not in  acute distress.    Appearance: Normal appearance. She is not ill-appearing, toxic-appearing or diaphoretic.  Eyes:     General: No scleral icterus. Cardiovascular:     Rate and Rhythm: Normal rate and regular rhythm.     Heart sounds: Normal heart sounds.  Pulmonary:     Effort: Pulmonary effort is normal. No respiratory distress.     Breath sounds: Normal breath sounds. No wheezing or rhonchi.  Abdominal:     General: Abdomen is flat. Bowel sounds are normal.     Palpations: Abdomen is soft.     Tenderness: There is generalized abdominal tenderness. There is no right CVA tenderness or left CVA tenderness.  Skin:    General: Skin is warm.  Neurological:     Mental Status: She is alert and oriented to person, place, and time.  Psychiatric:        Mood and Affect: Mood normal.        Behavior: Behavior normal.      UC Treatments / Results  Labs (all labs ordered are listed, but only abnormal results are displayed) Labs Reviewed  CERVICOVAGINAL ANCILLARY ONLY    EKG   Radiology No results found.  Procedures Procedures (including critical care time)  Medications Ordered in UC Medications - No data to display  Initial Impression / Assessment and Plan / UC Course  I have reviewed the triage vital signs and the nursing notes.  Pertinent labs & imaging results that were available during my care of the patient were reviewed by me and considered in my medical decision making (see chart for details).    Final Clinical Impressions(s) / UC Diagnoses   Final diagnoses:  Contact with and (suspected) exposure to infections with a predominantly sexual mode of transmission  Nausea without vomiting   Discharge Instructions   None    ED Prescriptions     Medication Sig Dispense Auth. Provider   ondansetron  (ZOFRAN -ODT) 8 MG disintegrating tablet Take 1 tablet (8 mg  total) by mouth every 8 (eight) hours as needed for nausea or vomiting. 21 tablet Andra Corean BROCKS, PA-C       PDMP not reviewed this encounter.   Andra Corean BROCKS, PA-C 04/21/24 1140

## 2024-04-22 LAB — CERVICOVAGINAL ANCILLARY ONLY
Bacterial Vaginitis (gardnerella): NEGATIVE
Candida Glabrata: NEGATIVE
Candida Vaginitis: NEGATIVE
Chlamydia: NEGATIVE
Comment: NEGATIVE
Comment: NEGATIVE
Comment: NEGATIVE
Comment: NEGATIVE
Comment: NEGATIVE
Comment: NORMAL
Neisseria Gonorrhea: NEGATIVE
Trichomonas: NEGATIVE

## 2024-05-07 ENCOUNTER — Ambulatory Visit
Admission: RE | Admit: 2024-05-07 | Discharge: 2024-05-07 | Disposition: A | Discharge status: Home / Self Care | Source: Ambulatory Visit

## 2024-05-07 VITALS — BP 135/84 | HR 75 | Temp 98.5°F | Resp 20 | Ht 65.5 in | Wt 215.0 lb

## 2024-05-07 DIAGNOSIS — J029 Acute pharyngitis, unspecified: Secondary | ICD-10-CM

## 2024-05-07 DIAGNOSIS — J039 Acute tonsillitis, unspecified: Secondary | ICD-10-CM

## 2024-05-07 LAB — POCT RAPID STREP A (OFFICE): Rapid Strep A Screen: NEGATIVE

## 2024-05-07 MED ORDER — FLUCONAZOLE 150 MG PO TABS: 150.0000 mg | ORAL_TABLET | Freq: Every day | ORAL | 0 refills | Status: AC

## 2024-05-07 MED ORDER — AMOXICILLIN 875 MG PO TABS: 875.0000 mg | ORAL_TABLET | Freq: Two times a day (BID) | ORAL | 0 refills | Status: AC

## 2024-05-07 NOTE — Discharge Instructions (Addendum)
 Strep testing done today.  This was negative.  Symptoms and physical exam findings are consistent with acute tonsillitis.  There is some swelling of the tonsillar lymph nodes as well.  Due to the severity of symptoms we will treat with antibiotics by mouth.  We will treat with the following: Amoxicillin  875 mg twice daily for 7 days.  This is an antibiotic.  Take this with food.  Diflucan  150 mg take 1 tablet on day 2 of antibiotics and then repeat in 3 days.  May use over-the-counter lozenges for sore throat May alternate Tylenol  and ibuprofen  for pain Make sure to stay hydrated by drinking plenty of water.  Return to urgent care or PCP if symptoms worsen or fail to resolve.

## 2024-05-07 NOTE — ED Triage Notes (Signed)
 Patient reports tonsils swollen. Pain at times that extends to right ear. Symptoms began Yesterday. No fever. Congestion. No new/unexplained rash.

## 2024-05-07 NOTE — ED Provider Notes (Signed)
 [name]    CSN: [id number] Arrival date & time: 05/07/24  1655      History   Chief Complaint Chief Complaint  Patient presents with   Throat Problem    HPI Barbara Zimmerman is a 33 y.o. female.   33 year old female who presents urgent care with complaints of sore throat, trouble swallowing, swollen tonsils and pain radiating into the right ear.  She also has noted some pain down into the neck especially on the right side just below the ear.  She denies any fevers or chills.  Her symptoms started yesterday.  They have gotten worse but do tend to be worse first thing in the morning.  She denies any cough, congestion, shortness of breath, chest pain.     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 Authorizing Provider  amoxicillin  (AMOXIL ) 875 MG tablet Take 1 tablet (875 mg total) by mouth 2 (two) times daily for 7 days. 05/07/24 05/14/24 Yes Heberto Sturdevant A, PA-C  fluconazole  (DIFLUCAN ) 150 MG tablet Take 1 tablet (150 mg total) by mouth daily for 2 days. take 1 tablet on day 2 of antibiotics and then repeat in 3 days 05/07/24 05/09/24 Yes Nancie Bocanegra A, PA-C  Hydrocortisone , Perianal, 1 % CREA Apply topically 2 (two) times daily. 04/07/24  Yes  [provider]  phentermine  30 MG capsule Take 30 mg by mouth every morning.   Yes [provider]  TRI-ESTARYLLA  0.18/0.215/0.25 MG-35 MCG tablet Take 1 tablet by mouth daily. 06/28/21  Yes [provider]  famotidine  (PEPCID ) 20 MG tablet Take 1 tablet (20 mg total) by mouth every evening. 02/19/24   Iola Lukes, FNP  hydrocortisone  cream 1 % Apply to affected area 2 times daily 04/07/24   Graham, Laura E, PA-C  levocetirizine (XYZAL ) 5 MG tablet Take 1 tablet (5 mg total) by mouth at bedtime. 02/19/24   Murrill, Samantha, FNP  metFORMIN (GLUCOPHAGE) 500 MG tablet Take 500 mg by mouth daily with breakfast. 03/03/24   [provider]  omeprazole  (PRILOSEC) 20 MG capsule TAKE 1 CAPSULE BY MOUTH EVERY DAY 10/07/23   Soldatova, Liuba, MD  ondansetron  (ZOFRAN -ODT) 8 MG disintegrating tablet Take 1 tablet (8 mg total) by mouth every 8 (eight) hours as needed for nausea or vomiting. 04/21/24   Andra Corean BROCKS, PA-C    Family History Family History  Problem Relation Age of Onset   Diabetes  Mother     Social History Social History[1]   Allergies   Penicillins, Shellfish allergy, and Metronidazole    Review of Systems Review of Systems  Constitutional:  Negative for chills and fever.  HENT:  Positive for ear pain (Right ear), sore throat and trouble swallowing.   Eyes:  Negative for pain and visual disturbance.  Respiratory:  Negative for cough and shortness of breath.   Cardiovascular:  Negative for chest pain and palpitations.  Gastrointestinal:  Negative for abdominal pain and vomiting.  Genitourinary:  Negative for dysuria and hematuria.  Musculoskeletal:  Negative for arthralgias and back pain.  Skin:  Negative for color change and rash.  Neurological:  Negative for seizures and syncope.  All other systems reviewed and are negative.    Physical Exam Triage Vital Signs ED Triage Vitals  Encounter Vitals Group     BP 05/07/24 1703  135/84     Girls Systolic BP Percentile --      Girls Diastolic BP Percentile --      Boys Systolic BP Percentile --      Boys Diastolic BP Percentile --      Pulse Rate 05/07/24 1703 75     Resp 05/07/24 1703 20     Temp 05/07/24 1703 98.5 F (36.9 C)     Temp Source 05/07/24 1703 Oral     SpO2 05/07/24 1703 99 %     Weight 05/07/24 1701 215 lb (97.5 kg)     Height 05/07/24 1701 5' 5.5 (1.664 m)     Head Circumference --      Peak Flow --      Pain Score 05/07/24 1659 6     Pain Loc --      Pain Education --      Exclude from Growth Chart --    No data found.  Updated Vital Signs BP 135/84 (BP Location: Left Arm)   Pulse 75   Temp 98.5 F (36.9 C) (Oral)   Resp 20   Ht 5' 5.5 (1.664 m)   Wt 215 lb (97.5 kg)   LMP 03/09/2024 (Exact Date)   SpO2 99%   BMI 35.23 kg/m   Visual Acuity Right Eye Distance:   Left Eye Distance:   Bilateral Distance:    Right Eye Near:   Left Eye Near:    Bilateral Near:     Physical Exam Vitals and nursing note reviewed.  Constitutional:      General: She is not in acute distress.    Appearance: She is well-developed.  HENT:     Head: Normocephalic and atraumatic.  Eyes:     Conjunctiva/sclera: Conjunctivae normal.  Cardiovascular:     Rate and Rhythm: Normal rate and regular rhythm.     Heart sounds: No murmur heard. Pulmonary:     Effort: Pulmonary effort is normal. No respiratory distress.     Breath sounds: Normal breath sounds.  Abdominal:     Palpations: Abdomen is soft.     Tenderness: There is no abdominal tenderness.  Musculoskeletal:        General: No swelling.     Cervical back: Neck supple.  Lymphadenopathy:     Head:     Right side of head: Tonsillar adenopathy present.     Left side of head: Tonsillar adenopathy present.  Skin:    General: Skin is warm and dry.     Capillary Refill: Capillary refill takes less than 2 seconds.  Neurological:  Mental Status: She is alert.  Psychiatric:        Mood  and Affect: Mood normal.      UC Treatments / Results  Labs (all labs ordered are listed, but only abnormal results are displayed) Labs Reviewed  POCT RAPID STREP A (OFFICE) - Normal    EKG   Radiology No results found.  Procedures Procedures (including critical care time)  Medications Ordered in UC Medications - No data to display  Initial Impression / Assessment and Plan / UC Course  I have reviewed the triage vital signs and the nursing notes.  Pertinent labs & imaging results that were available during my care of the patient were reviewed by me and considered in my medical decision making (see chart for details).     Acute tonsillitis, unspecified etiology  Sore throat - Plan: POCT rapid strep A, POCT rapid strep A   Strep testing done today.  This was negative.  Symptoms and physical exam findings are consistent with acute tonsillitis.  There is some swelling of the tonsillar lymph nodes as well.  Due to the severity of symptoms we will treat with antibiotics by mouth.  We will treat with the following: Amoxicillin  875 mg twice daily for 7 days.  This is an antibiotic.  Take this with food.  Diflucan  150 mg take 1 tablet on day 2 of antibiotics and then repeat in 3 days.  May use over-the-counter lozenges for sore throat May alternate Tylenol  and ibuprofen  for pain Make sure to stay hydrated by drinking plenty of water.  Return to urgent care or PCP if symptoms worsen or fail to resolve.    Final Clinical Impressions(s) / UC Diagnoses   Final diagnoses:  Sore throat  Acute tonsillitis, unspecified etiology     Discharge Instructions      Strep testing done today.  This was negative.  Symptoms and physical exam findings are consistent with acute tonsillitis.  There is some swelling of the tonsillar lymph nodes as well.  Due to the severity of symptoms we will treat with antibiotics by mouth.  We will treat with the following: Amoxicillin  875 mg twice daily  for 7 days.  This is an antibiotic.  Take this with food.  Diflucan  150 mg take 1 tablet on day 2 of antibiotics and then repeat in 3 days.  May use over-the-counter lozenges for sore throat May alternate Tylenol  and ibuprofen  for pain Make sure to stay hydrated by drinking plenty of water.  Return to urgent care or PCP if symptoms worsen or fail to resolve.       ED Prescriptions     Medication Sig Dispense Auth. Provider   amoxicillin  (AMOXIL ) 875 MG tablet Take 1 tablet (875 mg total) by mouth 2 (two) times daily for 7 days. 14 tablet Isaac Dubie A, PA-C   fluconazole  (DIFLUCAN ) 150 MG tablet Take 1 tablet (150 mg total) by mouth daily for 2 days. take 1 tablet on day 2 of antibiotics and then repeat in 3 days 2 tablet Teresa Almarie LABOR, PA-C      PDMP not reviewed this encounter.    [1]  Social History Tobacco Use   Smoking status: Former    Current packs/day: 0.50    Average packs/day: 0.5 packs/day for 5.0 years (2.5 ttl pk-yrs)    Types: Cigarettes    Passive exposure: Past   Smokeless tobacco: Never  Vaping Use   Vaping status: Never Used  Substance Use Topics  Alcohol use: Not Currently   Drug use: No     Teresa Almarie LABOR, PA-C 05/07/24 1743

## 2024-06-08 ENCOUNTER — Inpatient Hospital Stay: Admission: RE | Admit: 2024-06-08 | Source: Ambulatory Visit
